# Patient Record
Sex: Female | Born: 1960 | Race: Black or African American | Hispanic: No | Smoking: Current every day smoker
Health system: Southern US, Community
[De-identification: ages and names within clinical notes are randomized; demographics above are authoritative.]

## PROBLEM LIST (undated history)

## (undated) DIAGNOSIS — N760 Acute vaginitis: Secondary | ICD-10-CM

## (undated) DIAGNOSIS — G4730 Sleep apnea, unspecified: Secondary | ICD-10-CM

## (undated) DIAGNOSIS — F329 Major depressive disorder, single episode, unspecified: Secondary | ICD-10-CM

## (undated) DIAGNOSIS — R7303 Prediabetes: Secondary | ICD-10-CM

## (undated) DIAGNOSIS — K219 Gastro-esophageal reflux disease without esophagitis: Secondary | ICD-10-CM

## (undated) DIAGNOSIS — F32A Depression, unspecified: Secondary | ICD-10-CM

## (undated) DIAGNOSIS — I10 Essential (primary) hypertension: Secondary | ICD-10-CM

## (undated) DIAGNOSIS — G8929 Other chronic pain: Secondary | ICD-10-CM

## (undated) DIAGNOSIS — N289 Disorder of kidney and ureter, unspecified: Secondary | ICD-10-CM

## (undated) DIAGNOSIS — B9689 Other specified bacterial agents as the cause of diseases classified elsewhere: Secondary | ICD-10-CM

## (undated) DIAGNOSIS — S0300XA Dislocation of jaw, unspecified side, initial encounter: Secondary | ICD-10-CM

## (undated) DIAGNOSIS — E0590 Thyrotoxicosis, unspecified without thyrotoxic crisis or storm: Secondary | ICD-10-CM

## (undated) DIAGNOSIS — M549 Dorsalgia, unspecified: Secondary | ICD-10-CM

## (undated) DIAGNOSIS — D649 Anemia, unspecified: Secondary | ICD-10-CM

## (undated) DIAGNOSIS — F419 Anxiety disorder, unspecified: Secondary | ICD-10-CM

## (undated) DIAGNOSIS — C801 Malignant (primary) neoplasm, unspecified: Secondary | ICD-10-CM

## (undated) HISTORY — DX: Essential (primary) hypertension: I10

## (undated) HISTORY — DX: Prediabetes: R73.03

## (undated) HISTORY — DX: Thyrotoxicosis, unspecified without thyrotoxic crisis or storm: E05.90

## (undated) HISTORY — PX: DILATION AND CURETTAGE OF UTERUS: SHX78

## (undated) HISTORY — PX: MOUTH SURGERY: SHX715

## (undated) HISTORY — PX: OTHER SURGICAL HISTORY: SHX169

---

## 1997-10-27 ENCOUNTER — Emergency Department (HOSPITAL_COMMUNITY): Admission: EM | Admit: 1997-10-27 | Discharge: 1997-10-27 | Payer: Self-pay | Admitting: Emergency Medicine

## 1997-10-29 ENCOUNTER — Emergency Department (HOSPITAL_COMMUNITY): Admission: EM | Admit: 1997-10-29 | Discharge: 1997-10-29 | Payer: Self-pay | Admitting: Emergency Medicine

## 1998-05-23 ENCOUNTER — Encounter: Admission: RE | Admit: 1998-05-23 | Discharge: 1998-05-23 | Payer: Self-pay | Admitting: Internal Medicine

## 1998-06-29 ENCOUNTER — Inpatient Hospital Stay (HOSPITAL_COMMUNITY): Admission: AD | Admit: 1998-06-29 | Discharge: 1998-06-29 | Payer: Self-pay | Admitting: Obstetrics & Gynecology

## 1998-08-25 ENCOUNTER — Encounter: Admission: RE | Admit: 1998-08-25 | Discharge: 1998-08-25 | Payer: Self-pay | Admitting: Internal Medicine

## 1998-08-25 ENCOUNTER — Emergency Department (HOSPITAL_COMMUNITY): Admission: EM | Admit: 1998-08-25 | Discharge: 1998-08-25 | Payer: Self-pay | Admitting: Internal Medicine

## 1998-08-31 ENCOUNTER — Ambulatory Visit (HOSPITAL_COMMUNITY): Admission: RE | Admit: 1998-08-31 | Discharge: 1998-08-31 | Payer: Self-pay | Admitting: Internal Medicine

## 1998-08-31 ENCOUNTER — Encounter: Admission: RE | Admit: 1998-08-31 | Discharge: 1998-08-31 | Payer: Self-pay | Admitting: Internal Medicine

## 1998-12-26 ENCOUNTER — Emergency Department (HOSPITAL_COMMUNITY): Admission: EM | Admit: 1998-12-26 | Discharge: 1998-12-26 | Payer: Self-pay | Admitting: Emergency Medicine

## 1998-12-26 ENCOUNTER — Encounter: Payer: Self-pay | Admitting: Emergency Medicine

## 1999-04-23 ENCOUNTER — Emergency Department (HOSPITAL_COMMUNITY): Admission: EM | Admit: 1999-04-23 | Discharge: 1999-04-23 | Payer: Self-pay | Admitting: Emergency Medicine

## 2000-02-24 ENCOUNTER — Inpatient Hospital Stay (HOSPITAL_COMMUNITY): Admission: AD | Admit: 2000-02-24 | Discharge: 2000-02-24 | Payer: Self-pay | Admitting: Obstetrics

## 2000-04-21 ENCOUNTER — Encounter: Admission: RE | Admit: 2000-04-21 | Discharge: 2000-04-21 | Payer: Self-pay | Admitting: Internal Medicine

## 2000-04-22 ENCOUNTER — Encounter: Admission: RE | Admit: 2000-04-22 | Discharge: 2000-04-22 | Payer: Self-pay

## 2000-11-08 ENCOUNTER — Encounter: Payer: Self-pay | Admitting: Emergency Medicine

## 2000-11-08 ENCOUNTER — Emergency Department (HOSPITAL_COMMUNITY): Admission: EM | Admit: 2000-11-08 | Discharge: 2000-11-08 | Payer: Self-pay | Admitting: Emergency Medicine

## 2001-01-24 ENCOUNTER — Encounter: Payer: Self-pay | Admitting: Emergency Medicine

## 2001-01-24 ENCOUNTER — Emergency Department (HOSPITAL_COMMUNITY): Admission: EM | Admit: 2001-01-24 | Discharge: 2001-01-24 | Payer: Self-pay | Admitting: Emergency Medicine

## 2001-02-01 ENCOUNTER — Emergency Department (HOSPITAL_COMMUNITY): Admission: EM | Admit: 2001-02-01 | Discharge: 2001-02-01 | Payer: Self-pay | Admitting: Emergency Medicine

## 2001-11-03 ENCOUNTER — Encounter: Admission: RE | Admit: 2001-11-03 | Discharge: 2001-11-03 | Payer: Self-pay | Admitting: *Deleted

## 2002-02-26 ENCOUNTER — Encounter: Payer: Self-pay | Admitting: Emergency Medicine

## 2002-02-26 ENCOUNTER — Emergency Department (HOSPITAL_COMMUNITY): Admission: EM | Admit: 2002-02-26 | Discharge: 2002-02-26 | Payer: Self-pay | Admitting: Emergency Medicine

## 2002-12-23 ENCOUNTER — Emergency Department (HOSPITAL_COMMUNITY): Admission: EM | Admit: 2002-12-23 | Discharge: 2002-12-23 | Payer: Self-pay | Admitting: Emergency Medicine

## 2002-12-23 ENCOUNTER — Encounter: Payer: Self-pay | Admitting: Emergency Medicine

## 2003-01-01 ENCOUNTER — Inpatient Hospital Stay (HOSPITAL_COMMUNITY): Admission: AD | Admit: 2003-01-01 | Discharge: 2003-01-01 | Payer: Self-pay | Admitting: Obstetrics & Gynecology

## 2003-04-22 ENCOUNTER — Emergency Department (HOSPITAL_COMMUNITY): Admission: EM | Admit: 2003-04-22 | Discharge: 2003-04-22 | Payer: Self-pay | Admitting: Emergency Medicine

## 2004-01-31 ENCOUNTER — Encounter (INDEPENDENT_AMBULATORY_CARE_PROVIDER_SITE_OTHER): Payer: Self-pay | Admitting: Specialist

## 2004-01-31 ENCOUNTER — Other Ambulatory Visit: Admission: RE | Admit: 2004-01-31 | Discharge: 2004-01-31 | Payer: Self-pay | Admitting: Obstetrics & Gynecology

## 2004-01-31 ENCOUNTER — Ambulatory Visit: Payer: Self-pay | Admitting: Obstetrics & Gynecology

## 2004-02-16 ENCOUNTER — Ambulatory Visit (HOSPITAL_COMMUNITY): Admission: RE | Admit: 2004-02-16 | Discharge: 2004-02-16 | Payer: Self-pay | Admitting: *Deleted

## 2004-02-28 ENCOUNTER — Ambulatory Visit: Payer: Self-pay | Admitting: Obstetrics & Gynecology

## 2004-10-19 ENCOUNTER — Emergency Department (HOSPITAL_COMMUNITY): Admission: EM | Admit: 2004-10-19 | Discharge: 2004-10-20 | Payer: Self-pay | Admitting: Emergency Medicine

## 2004-12-20 ENCOUNTER — Emergency Department (HOSPITAL_COMMUNITY): Admission: EM | Admit: 2004-12-20 | Discharge: 2004-12-20 | Payer: Self-pay | Admitting: Emergency Medicine

## 2005-09-08 ENCOUNTER — Inpatient Hospital Stay (HOSPITAL_COMMUNITY): Admission: AD | Admit: 2005-09-08 | Discharge: 2005-09-08 | Payer: Self-pay | Admitting: Gynecology

## 2005-10-25 ENCOUNTER — Inpatient Hospital Stay (HOSPITAL_COMMUNITY): Admission: AD | Admit: 2005-10-25 | Discharge: 2005-10-25 | Payer: Self-pay | Admitting: Family Medicine

## 2007-04-04 ENCOUNTER — Emergency Department (HOSPITAL_COMMUNITY): Admission: EM | Admit: 2007-04-04 | Discharge: 2007-04-04 | Payer: Self-pay | Admitting: Emergency Medicine

## 2007-08-13 ENCOUNTER — Ambulatory Visit (HOSPITAL_COMMUNITY): Admission: RE | Admit: 2007-08-13 | Discharge: 2007-08-13 | Payer: Self-pay | Admitting: Obstetrics

## 2008-05-25 ENCOUNTER — Emergency Department (HOSPITAL_COMMUNITY): Admission: EM | Admit: 2008-05-25 | Discharge: 2008-05-25 | Payer: Self-pay | Admitting: Emergency Medicine

## 2009-01-28 ENCOUNTER — Emergency Department (HOSPITAL_COMMUNITY): Admission: EM | Admit: 2009-01-28 | Discharge: 2009-01-28 | Payer: Self-pay | Admitting: Emergency Medicine

## 2009-08-03 ENCOUNTER — Emergency Department (HOSPITAL_COMMUNITY): Admission: EM | Admit: 2009-08-03 | Discharge: 2009-08-03 | Payer: Self-pay | Admitting: Emergency Medicine

## 2009-12-24 ENCOUNTER — Emergency Department (HOSPITAL_COMMUNITY): Admission: EM | Admit: 2009-12-24 | Discharge: 2009-12-24 | Payer: Self-pay | Admitting: Emergency Medicine

## 2010-05-05 ENCOUNTER — Encounter: Payer: Self-pay | Admitting: *Deleted

## 2010-06-28 LAB — RAPID STREP SCREEN (MED CTR MEBANE ONLY): Streptococcus, Group A Screen (Direct): NEGATIVE

## 2010-07-03 LAB — URINE MICROSCOPIC-ADD ON

## 2010-07-03 LAB — URINALYSIS, ROUTINE W REFLEX MICROSCOPIC
Bilirubin Urine: NEGATIVE
Glucose, UA: NEGATIVE mg/dL
Ketones, ur: NEGATIVE mg/dL
Leukocytes, UA: NEGATIVE
Nitrite: NEGATIVE
Protein, ur: NEGATIVE mg/dL
Specific Gravity, Urine: 1.025 (ref 1.005–1.030)
Urobilinogen, UA: 0.2 mg/dL (ref 0.0–1.0)
pH: 5.5 (ref 5.0–8.0)

## 2010-07-03 LAB — POCT PREGNANCY, URINE: Preg Test, Ur: NEGATIVE

## 2010-08-31 NOTE — Group Therapy Note (Signed)
NAME:  Beth Cole, Beth Cole NO.:  [id number]   MEDICAL RECORD NO.:  [id number]          PATIENT TYPE:  WOC   LOCATION:  [facility]                   FACILITY:  [facility]   PHYSICIAN:  Elsie Lincoln, MD      DATE OF BIRTH:  10-06-1960   DATE OF SERVICE:  02/28/2004                                    CLINIC NOTE   REASON FOR VISIT:  The patient presents for follow-up from a January 31, 2004 visit.  She had one [facility] appointment on February 21, 2004.  The patient  was seen on October 18 for a 30-month-long menses.  She had an endometrial  biopsy done which was negative, Pap smear was negative.  GC and chlamydia  were also done and she is positive for chlamydia.  She was treated with  Zithromax and she said all her partners were as well.  The wet prep was done  which showed bacterial vaginosis and she said she took 7 days of Flagyl  b.i.d..  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  The patient also had a negative BIRADS 1  mammogram on February 16, 2004.  Today, the patient presents for her test  results and test of cure for chlamydia.  Of note, she complains of  depression and wants to start on medication.  She states she is having  problems sleeping and eating and just feels generally bad.  She is lacking  motivation, feels irritable, moody, and these meet the criteria for  depression.  She does deny suicidal ideation.  No physical exam was done  today.  The patient also states she is going through questions that her  husband wants another baby.  However, she is undergoing a lot of social  issues right now and does not feel that she wants one.  However, given she  has had a laparoscopic BTL, she would be referred to the infertility clinic  at [facility] for either tubal reanastomosis versus IVF.   ASSESSMENT AND PLAN:  A 50 year old female  with abnormal uterine bleeding  that is now resolved, no evidence of malignancy, questionable submucosal  fibroid.   1.  TOC today for chlamydia.  2.  The patient decides no further testing right now for abnormal uterine      bleeding.  If the bleeding reoccurs, will refer for saline      sonohysterogram.  3.  Start Zoloft 50 mg p.o. daily, 2 refills given.  The patient is to now      go to [facility] for follow-up of her depression.  The patient also      told to go to the emergency room if she has any suicidal ideation.  4.  Referral to [facility] if she desires pregnancy.  5.  Return to clinic in October 2006 for Pap smear, or p.r.n. if problem      arises.      KL/MEDQ  D:  02/28/2004  T:  02/28/2004  Job:  [id number]

## 2010-08-31 NOTE — Group Therapy Note (Signed)
NAME:  NZINGA, Beth Cole NO.:  [id number]   MEDICAL RECORD NO.:  [id number]          PATIENT TYPE:  WOC   LOCATION:  [facility]                   FACILITY:  [facility]   PHYSICIAN:  Elsie Lincoln, MD      DATE OF BIRTH:  [birth year]   DATE OF SERVICE:  01/31/2004                                    CLINIC NOTE   REASON FOR VISIT:  The patient is a 50 year old para 3-0-3-3 female who has  been seen in our clinic many years ago who presents for abnormal bleeding x1  month.  The patient states that her menses were completely regular every  month until this past month, and then since then she has bled for 5 days,  stopped for 5 days, bled for 5 days, and stopped for 5 days continuing on  the entire month.  She says there is no cramping associated with this.  She  had gone to the MAU earlier this month; however, left before being seen.  UCG was negative today.   PAST MEDICAL HISTORY:  The patient states she has hypertension; however, her  blood pressure today is 110/85.   PAST SURGICAL HISTORY:  Laparoscopic BTL.   GYNECOLOGICAL HISTORY:  Three NSVDs, two miscarriages, and one TOP.  The  patient states she had heavy bleeding with one of her vaginal deliveries  requiring going to the OR.  The patient does not have a scar on her belly,  so there was not a hysterectomy done or any heroic means done  transabdominally.  The patient states she may have been packed; she does not  know what happened.  She has had no problems bleeding since then.  The  patient states she maybe has fibroids and maybe has ovarian cysts, is not  being treated for these problems currently.  She has had STDs in the past;  however, she will not qualify what they are, when they were, but she said  they are so far in her past that it is not relevant.  She is sexually active  currently and she uses condoms for protection against STDs, and she does  have a BTL for prevention of pregnancy.   ALLERGIES:   Questionable PENICILLIN.   CURRENT MEDICATIONS:  A sleep aid and Advil.   PHYSICAL EXAMINATION:  VITAL SIGNS:  Temperature 97.2, pulse 98, blood  pressure 110/85, weight 128.3, height 5 feet 2 inches.  ABDOMEN:  Soft, nontender, nondistended.  No rebound, no guarding.  PELVIC:  Genitalia:  Tanner V.  Vagina:  Pink, normal rugae.  No blood or  discharge.  Cervix:  Closed, nontender.  Uterus:  Small, anteverted,  nontender, mobile.  Adnexa:  Ovaries palpated, small, nontender, no masses.   ASSESSMENT AND PLAN:  A 50 year old female with abnormal uterine bleeding.   1.  Pap smear, cultures, and wet prep done.  2.  Endometrial biopsy done.  3.  CBC done to see if she is anemic.  4.  Transvaginal ultrasound ordered.  5.  The patient needs a mammogram ordered next visit.  6.  Smoking cessation encouraged.      KL/MEDQ  D:  01/31/2004  T:  01/31/2004  Job:  [id number]

## 2010-09-30 ENCOUNTER — Emergency Department (HOSPITAL_COMMUNITY)
Admission: EM | Admit: 2010-09-30 | Discharge: 2010-09-30 | Disposition: A | Discharge status: Home / Self Care | Payer: Self-pay | Attending: Emergency Medicine | Admitting: Emergency Medicine

## 2010-09-30 DIAGNOSIS — R209 Unspecified disturbances of skin sensation: Secondary | ICD-10-CM | POA: Insufficient documentation

## 2010-09-30 DIAGNOSIS — M79609 Pain in unspecified limb: Secondary | ICD-10-CM | POA: Insufficient documentation

## 2010-09-30 DIAGNOSIS — R5383 Other fatigue: Secondary | ICD-10-CM | POA: Insufficient documentation

## 2010-09-30 DIAGNOSIS — K219 Gastro-esophageal reflux disease without esophagitis: Secondary | ICD-10-CM | POA: Insufficient documentation

## 2010-09-30 DIAGNOSIS — R5381 Other malaise: Secondary | ICD-10-CM | POA: Insufficient documentation

## 2010-11-26 ENCOUNTER — Emergency Department (HOSPITAL_COMMUNITY)
Admission: EM | Admit: 2010-11-26 | Discharge: 2010-11-26 | Disposition: A | Discharge status: Home / Self Care | Payer: Self-pay | Attending: Emergency Medicine | Admitting: Emergency Medicine

## 2010-11-26 DIAGNOSIS — N938 Other specified abnormal uterine and vaginal bleeding: Secondary | ICD-10-CM | POA: Insufficient documentation

## 2010-11-26 DIAGNOSIS — N949 Unspecified condition associated with female genital organs and menstrual cycle: Secondary | ICD-10-CM | POA: Insufficient documentation

## 2010-11-26 LAB — URINALYSIS, ROUTINE W REFLEX MICROSCOPIC
Bilirubin Urine: NEGATIVE
Glucose, UA: NEGATIVE mg/dL
Ketones, ur: NEGATIVE mg/dL
Leukocytes, UA: NEGATIVE
Nitrite: NEGATIVE
Protein, ur: NEGATIVE mg/dL
Specific Gravity, Urine: 1.03 (ref 1.005–1.030)
Urobilinogen, UA: 0.2 mg/dL (ref 0.0–1.0)
pH: 5 (ref 5.0–8.0)

## 2010-11-26 LAB — WET PREP, GENITAL
Trich, Wet Prep: NONE SEEN
Yeast Wet Prep HPF POC: NONE SEEN

## 2010-11-26 LAB — POCT PREGNANCY, URINE: Preg Test, Ur: NEGATIVE

## 2010-11-26 LAB — URINE MICROSCOPIC-ADD ON

## 2010-11-27 LAB — GC/CHLAMYDIA PROBE AMP, GENITAL
Chlamydia, DNA Probe: NEGATIVE
GC Probe Amp, Genital: NEGATIVE

## 2011-01-18 LAB — URINALYSIS, ROUTINE W REFLEX MICROSCOPIC
Bilirubin Urine: NEGATIVE
Glucose, UA: NEGATIVE
Ketones, ur: NEGATIVE
Nitrite: NEGATIVE
Protein, ur: 100 — AB
Specific Gravity, Urine: 1.036 — ABNORMAL HIGH
Urobilinogen, UA: 0.2
pH: 6

## 2011-01-18 LAB — DIFFERENTIAL
Basophils Absolute: 0
Basophils Relative: 0
Eosinophils Absolute: 0.1 — ABNORMAL LOW
Neutrophils Relative %: 79 — ABNORMAL HIGH

## 2011-01-18 LAB — CBC
HCT: 34.2 — ABNORMAL LOW
Hemoglobin: 11.6 — ABNORMAL LOW
MCHC: 34
MCV: 83.6
Platelets: 357
RBC: 4.1
RDW: 13.5
WBC: 10.3

## 2011-01-18 LAB — COMPREHENSIVE METABOLIC PANEL
ALT: 11
Alkaline Phosphatase: 61
CO2: 27
Calcium: 9.4
GFR calc non Af Amer: >60
Glucose, Bld: 101 — ABNORMAL HIGH
Potassium: 3.5
Sodium: 136

## 2011-01-18 LAB — URINE MICROSCOPIC-ADD ON

## 2011-01-18 LAB — PREGNANCY, URINE: Preg Test, Ur: NEGATIVE

## 2011-01-18 LAB — LIPASE, BLOOD: Lipase: 69 — ABNORMAL HIGH

## 2011-01-18 LAB — WET PREP, GENITAL: Yeast Wet Prep HPF POC: NONE SEEN

## 2011-01-23 ENCOUNTER — Emergency Department (HOSPITAL_COMMUNITY)
Admission: EM | Admit: 2011-01-23 | Discharge: 2011-01-24 | Disposition: A | Discharge status: Home / Self Care | Payer: Self-pay | Attending: Emergency Medicine | Admitting: Emergency Medicine

## 2011-01-23 DIAGNOSIS — S335XXA Sprain of ligaments of lumbar spine, initial encounter: Secondary | ICD-10-CM | POA: Insufficient documentation

## 2011-01-23 DIAGNOSIS — M549 Dorsalgia, unspecified: Secondary | ICD-10-CM | POA: Insufficient documentation

## 2011-01-23 DIAGNOSIS — X58XXXA Exposure to other specified factors, initial encounter: Secondary | ICD-10-CM | POA: Insufficient documentation

## 2011-01-23 DIAGNOSIS — J040 Acute laryngitis: Secondary | ICD-10-CM | POA: Insufficient documentation

## 2011-10-18 ENCOUNTER — Encounter (HOSPITAL_COMMUNITY): Payer: Self-pay

## 2011-10-18 ENCOUNTER — Inpatient Hospital Stay (HOSPITAL_COMMUNITY)
Admission: AD | Admit: 2011-10-18 | Discharge: 2011-10-18 | Disposition: A | Discharge status: Home / Self Care | Payer: Medicaid Other | Source: Ambulatory Visit | Attending: Obstetrics & Gynecology | Admitting: Obstetrics & Gynecology

## 2011-10-18 DIAGNOSIS — N760 Acute vaginitis: Secondary | ICD-10-CM

## 2011-10-18 DIAGNOSIS — B9689 Other specified bacterial agents as the cause of diseases classified elsewhere: Secondary | ICD-10-CM | POA: Insufficient documentation

## 2011-10-18 DIAGNOSIS — A499 Bacterial infection, unspecified: Secondary | ICD-10-CM

## 2011-10-18 DIAGNOSIS — N949 Unspecified condition associated with female genital organs and menstrual cycle: Secondary | ICD-10-CM | POA: Insufficient documentation

## 2011-10-18 HISTORY — DX: Depression, unspecified: F32.A

## 2011-10-18 HISTORY — DX: Acute vaginitis: N76.0

## 2011-10-18 HISTORY — DX: Major depressive disorder, single episode, unspecified: F32.9

## 2011-10-18 HISTORY — DX: Other chronic pain: G89.29

## 2011-10-18 HISTORY — DX: Gastro-esophageal reflux disease without esophagitis: K21.9

## 2011-10-18 HISTORY — DX: Other specified bacterial agents as the cause of diseases classified elsewhere: B96.89

## 2011-10-18 HISTORY — DX: Dorsalgia, unspecified: M54.9

## 2011-10-18 HISTORY — DX: Anxiety disorder, unspecified: F41.9

## 2011-10-18 HISTORY — DX: Sleep apnea, unspecified: G47.30

## 2011-10-18 LAB — WET PREP, GENITAL: Trich, Wet Prep: NONE SEEN

## 2011-10-18 MED ORDER — METRONIDAZOLE 500 MG PO TABS: 500.0000 mg | ORAL_TABLET | Freq: Two times a day (BID) | ORAL | Status: AC

## 2011-10-18 NOTE — MAU Note (Signed)
Patient is in with c/o broken off tampon for 2 days. She states that 2 days ago she was removing her tampon and the string snapped with half of the tampon out. She states that the remainder is still inside and she waited to see if it will come out on its own. She now have odor and slight discomfort. She states that she did not attempt to retrieve it because when this type of thing happened to her in the past. She scratched herself it caused an infection. She states that she have no bleeding now,
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
## 2011-10-18 NOTE — MAU Note (Signed)
I tried to pull tampon out few days ago and it broke off. Very irritated down there"

## 2011-10-19 LAB — GC/CHLAMYDIA PROBE AMP, GENITAL
Chlamydia, DNA Probe: NEGATIVE
GC Probe Amp, Genital: NEGATIVE

## 2011-10-19 NOTE — MAU Provider Note (Signed)
Attestation of Attending Supervision of Advanced Practitioner (CNM/NP): Evaluation and management procedures were performed by the Advanced Practitioner under my supervision and collaboration.  I have reviewed the Advanced Practitioner's note and chart, and I agree with the management and plan.  Latrelle Bazar, M.D. 10/19/2011 7:35 AM  

## 2012-04-19 ENCOUNTER — Emergency Department (HOSPITAL_COMMUNITY)
Admission: EM | Admit: 2012-04-19 | Discharge: 2012-04-19 | Disposition: A | Discharge status: Home / Self Care | Payer: Medicaid Other | Attending: Emergency Medicine | Admitting: Emergency Medicine

## 2012-04-19 ENCOUNTER — Emergency Department (HOSPITAL_COMMUNITY): Payer: Medicaid Other

## 2012-04-19 ENCOUNTER — Encounter (HOSPITAL_COMMUNITY): Payer: Self-pay | Admitting: *Deleted

## 2012-04-19 DIAGNOSIS — G8929 Other chronic pain: Secondary | ICD-10-CM | POA: Insufficient documentation

## 2012-04-19 DIAGNOSIS — R059 Cough, unspecified: Secondary | ICD-10-CM | POA: Insufficient documentation

## 2012-04-19 DIAGNOSIS — Z79899 Other long term (current) drug therapy: Secondary | ICD-10-CM | POA: Insufficient documentation

## 2012-04-19 DIAGNOSIS — B9789 Other viral agents as the cause of diseases classified elsewhere: Secondary | ICD-10-CM | POA: Insufficient documentation

## 2012-04-19 DIAGNOSIS — K219 Gastro-esophageal reflux disease without esophagitis: Secondary | ICD-10-CM | POA: Insufficient documentation

## 2012-04-19 DIAGNOSIS — J3489 Other specified disorders of nose and nasal sinuses: Secondary | ICD-10-CM | POA: Insufficient documentation

## 2012-04-19 DIAGNOSIS — M549 Dorsalgia, unspecified: Secondary | ICD-10-CM | POA: Insufficient documentation

## 2012-04-19 DIAGNOSIS — R05 Cough: Secondary | ICD-10-CM | POA: Insufficient documentation

## 2012-04-19 DIAGNOSIS — Z87828 Personal history of other (healed) physical injury and trauma: Secondary | ICD-10-CM | POA: Insufficient documentation

## 2012-04-19 DIAGNOSIS — B349 Viral infection, unspecified: Secondary | ICD-10-CM

## 2012-04-19 DIAGNOSIS — Z8742 Personal history of other diseases of the female genital tract: Secondary | ICD-10-CM | POA: Insufficient documentation

## 2012-04-19 DIAGNOSIS — J029 Acute pharyngitis, unspecified: Secondary | ICD-10-CM | POA: Insufficient documentation

## 2012-04-19 DIAGNOSIS — Z8659 Personal history of other mental and behavioral disorders: Secondary | ICD-10-CM | POA: Insufficient documentation

## 2012-04-19 DIAGNOSIS — F17200 Nicotine dependence, unspecified, uncomplicated: Secondary | ICD-10-CM | POA: Insufficient documentation

## 2012-04-19 DIAGNOSIS — Z3202 Encounter for pregnancy test, result negative: Secondary | ICD-10-CM | POA: Insufficient documentation

## 2012-04-19 DIAGNOSIS — G4730 Sleep apnea, unspecified: Secondary | ICD-10-CM | POA: Insufficient documentation

## 2012-04-19 DIAGNOSIS — H9209 Otalgia, unspecified ear: Secondary | ICD-10-CM | POA: Insufficient documentation

## 2012-04-19 DIAGNOSIS — IMO0001 Reserved for inherently not codable concepts without codable children: Secondary | ICD-10-CM | POA: Insufficient documentation

## 2012-04-19 HISTORY — DX: Dislocation of jaw, unspecified side, initial encounter: S03.00XA

## 2012-04-19 MED ORDER — HYDROCODONE-HOMATROPINE 5-1.5 MG/5ML PO SYRP: 5.0000 mL | ORAL_SOLUTION | Freq: Four times a day (QID) | ORAL | Status: DC | PRN

## 2012-04-19 MED ORDER — GI COCKTAIL ~~LOC~~
30.0000 mL | Freq: Once | ORAL | Status: AC
  Administered 2012-04-19: 30 mL via ORAL
  Filled 2012-04-19: qty 30

## 2012-04-19 NOTE — ED Notes (Signed)
C/O TMJ pain bilaterally- recently dx with TMJ pain- hurts to open mouth, hurts with palpation over area
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
## 2012-04-19 NOTE — ED Notes (Signed)
Pt experiencing sore throat, runny nose, cough and "sweating" since Fri.  Afebrile at this time.  Pt came in today b/c of throat pain and tmj is continuing to bother her.

## 2012-05-31 ENCOUNTER — Emergency Department (HOSPITAL_COMMUNITY): Payer: Medicaid Other

## 2012-05-31 ENCOUNTER — Encounter (HOSPITAL_COMMUNITY): Payer: Self-pay | Admitting: Emergency Medicine

## 2012-05-31 ENCOUNTER — Emergency Department (HOSPITAL_COMMUNITY)
Admission: EM | Admit: 2012-05-31 | Discharge: 2012-05-31 | Disposition: A | Discharge status: Home / Self Care | Payer: Medicaid Other | Attending: Emergency Medicine | Admitting: Emergency Medicine

## 2012-05-31 DIAGNOSIS — Z8659 Personal history of other mental and behavioral disorders: Secondary | ICD-10-CM | POA: Insufficient documentation

## 2012-05-31 DIAGNOSIS — G8929 Other chronic pain: Secondary | ICD-10-CM | POA: Insufficient documentation

## 2012-05-31 DIAGNOSIS — Z79899 Other long term (current) drug therapy: Secondary | ICD-10-CM | POA: Insufficient documentation

## 2012-05-31 DIAGNOSIS — G4730 Sleep apnea, unspecified: Secondary | ICD-10-CM | POA: Insufficient documentation

## 2012-05-31 DIAGNOSIS — M545 Low back pain, unspecified: Secondary | ICD-10-CM | POA: Insufficient documentation

## 2012-05-31 DIAGNOSIS — F17200 Nicotine dependence, unspecified, uncomplicated: Secondary | ICD-10-CM | POA: Insufficient documentation

## 2012-05-31 DIAGNOSIS — K219 Gastro-esophageal reflux disease without esophagitis: Secondary | ICD-10-CM | POA: Insufficient documentation

## 2012-05-31 DIAGNOSIS — Z8739 Personal history of other diseases of the musculoskeletal system and connective tissue: Secondary | ICD-10-CM | POA: Insufficient documentation

## 2012-05-31 DIAGNOSIS — R319 Hematuria, unspecified: Secondary | ICD-10-CM | POA: Insufficient documentation

## 2012-05-31 DIAGNOSIS — D219 Benign neoplasm of connective and other soft tissue, unspecified: Secondary | ICD-10-CM | POA: Insufficient documentation

## 2012-05-31 DIAGNOSIS — D3002 Benign neoplasm of left kidney: Secondary | ICD-10-CM

## 2012-05-31 DIAGNOSIS — Z8619 Personal history of other infectious and parasitic diseases: Secondary | ICD-10-CM | POA: Insufficient documentation

## 2012-05-31 LAB — URINALYSIS, ROUTINE W REFLEX MICROSCOPIC
Glucose, UA: NEGATIVE mg/dL
Ketones, ur: NEGATIVE mg/dL
Protein, ur: NEGATIVE mg/dL

## 2012-05-31 LAB — LIPASE, BLOOD: Lipase: 41 U/L (ref 11–59)

## 2012-05-31 LAB — URINE MICROSCOPIC-ADD ON

## 2012-05-31 LAB — POCT I-STAT TROPONIN I

## 2012-05-31 LAB — PREGNANCY, URINE: Preg Test, Ur: NEGATIVE
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
## 2012-05-31 NOTE — ED Notes (Addendum)
Reports falling 2 weeks ago on her steps and PCP and specialist told her she has blood in urine after fall.  Pt believes she still has blood in urine and is currently having her period.  Pt also reports 2 other falls in the past 2 weeks. Denies known cause of falls and denies dizziness prior to falls.  Denies LOC. Reports abd swelling.

## 2012-05-31 NOTE — ED Notes (Signed)
Pt reports fell 2 weeks ago. Pt seen by PMD and other specialist and was told to come to ED for further eval of blood in urine. Pt also reports abdominal swelling since fall along with intermittent pain and n/v. Pt last BM 2 days ago.

## 2012-05-31 NOTE — ED Notes (Signed)
Karen gave the patient a warm blanket. 

## 2012-05-31 NOTE — ED Notes (Signed)
I gave the patient another warm blanket.

## 2012-07-10 ENCOUNTER — Encounter (HOSPITAL_COMMUNITY): Payer: Self-pay | Admitting: *Deleted

## 2012-07-10 ENCOUNTER — Emergency Department (HOSPITAL_COMMUNITY)
Admission: EM | Admit: 2012-07-10 | Discharge: 2012-07-10 | Disposition: A | Discharge status: Home / Self Care | Payer: Medicaid Other | Attending: Emergency Medicine | Admitting: Emergency Medicine

## 2012-07-10 DIAGNOSIS — K219 Gastro-esophageal reflux disease without esophagitis: Secondary | ICD-10-CM | POA: Insufficient documentation

## 2012-07-10 DIAGNOSIS — Z8742 Personal history of other diseases of the female genital tract: Secondary | ICD-10-CM | POA: Insufficient documentation

## 2012-07-10 DIAGNOSIS — R319 Hematuria, unspecified: Secondary | ICD-10-CM | POA: Insufficient documentation

## 2012-07-10 DIAGNOSIS — R109 Unspecified abdominal pain: Secondary | ICD-10-CM

## 2012-07-10 DIAGNOSIS — Z87828 Personal history of other (healed) physical injury and trauma: Secondary | ICD-10-CM | POA: Insufficient documentation

## 2012-07-10 DIAGNOSIS — N898 Other specified noninflammatory disorders of vagina: Secondary | ICD-10-CM | POA: Insufficient documentation

## 2012-07-10 DIAGNOSIS — G4730 Sleep apnea, unspecified: Secondary | ICD-10-CM | POA: Insufficient documentation

## 2012-07-10 DIAGNOSIS — D4959 Neoplasm of unspecified behavior of other genitourinary organ: Secondary | ICD-10-CM | POA: Insufficient documentation

## 2012-07-10 DIAGNOSIS — Z79899 Other long term (current) drug therapy: Secondary | ICD-10-CM | POA: Insufficient documentation

## 2012-07-10 DIAGNOSIS — F17200 Nicotine dependence, unspecified, uncomplicated: Secondary | ICD-10-CM | POA: Insufficient documentation

## 2012-07-10 DIAGNOSIS — Z8739 Personal history of other diseases of the musculoskeletal system and connective tissue: Secondary | ICD-10-CM | POA: Insufficient documentation

## 2012-07-10 DIAGNOSIS — Z8659 Personal history of other mental and behavioral disorders: Secondary | ICD-10-CM | POA: Insufficient documentation

## 2012-07-10 HISTORY — DX: Disorder of kidney and ureter, unspecified: N28.9

## 2012-07-10 LAB — CBC
HCT: 34.1 % — ABNORMAL LOW (ref 36.0–46.0)
MCHC: 32.8 g/dL (ref 30.0–36.0)
MCV: 85.3 fL (ref 78.0–100.0)
Platelets: 311 10*3/uL (ref 150–400)
RDW: 13.1 % (ref 11.5–15.5)

## 2012-07-10 LAB — COMPREHENSIVE METABOLIC PANEL
ALT: 15 U/L (ref 0–35)
AST: 16 U/L (ref 0–37)
Albumin: 3.4 g/dL — ABNORMAL LOW (ref 3.5–5.2)
Alkaline Phosphatase: 52 U/L (ref 39–117)
BUN: 10 mg/dL (ref 6–23)
CO2: 25 mEq/L (ref 19–32)
Calcium: 9.3 mg/dL (ref 8.4–10.5)
Chloride: 107 mEq/L (ref 96–112)
Creatinine, Ser: 0.84 mg/dL (ref 0.50–1.10)
GFR calc Af Amer: >90 mL/min (ref >90)
GFR calc non Af Amer: 80 mL/min — ABNORMAL LOW (ref >90)
Glucose, Bld: 121 mg/dL — ABNORMAL HIGH (ref 70–99)
Potassium: 3.9 mEq/L (ref 3.5–5.1)
Sodium: 140 mEq/L (ref 135–145)
Total Bilirubin: 0.1 mg/dL — ABNORMAL LOW (ref 0.3–1.2)
Total Protein: 6.8 g/dL (ref 6.0–8.3)

## 2012-07-10 LAB — URINE MICROSCOPIC-ADD ON

## 2012-07-10 LAB — URINALYSIS, ROUTINE W REFLEX MICROSCOPIC
Glucose, UA: NEGATIVE mg/dL
Ketones, ur: 15 mg/dL — AB
Protein, ur: 30 mg/dL — AB
Urobilinogen, UA: 1 mg/dL (ref 0.0–1.0)

## 2012-07-10 MED ORDER — OXYCODONE-ACETAMINOPHEN 5-325 MG PO TABS: 2.0000 | ORAL_TABLET | Freq: Four times a day (QID) | ORAL | Status: DC | PRN

## 2012-07-10 MED ORDER — OXYCODONE-ACETAMINOPHEN 5-325 MG PO TABS
2.0000 | ORAL_TABLET | Freq: Once | ORAL | Status: AC
  Administered 2012-07-10: 2 via ORAL
  Filled 2012-07-10: qty 2

## 2012-07-10 NOTE — ED Notes (Signed)
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  Pt was seen by Dr Brunilda Payor at [facility] urology and stated he would perform procedure in 2 wks.  Yesterday pt began having blood in urine.  Pt called urologist and he didn't respond.  She called her pcp who stated to come to ED.  Pt states her L flank continues to cause her pain. No improvement from discharge.
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
## 2012-08-20 ENCOUNTER — Other Ambulatory Visit: Payer: Self-pay | Admitting: Internal Medicine

## 2012-08-20 DIAGNOSIS — M549 Dorsalgia, unspecified: Secondary | ICD-10-CM

## 2012-08-27 ENCOUNTER — Other Ambulatory Visit: Payer: Self-pay

## 2012-09-16 ENCOUNTER — Other Ambulatory Visit: Payer: Self-pay | Admitting: Internal Medicine

## 2012-09-16 DIAGNOSIS — Z1231 Encounter for screening mammogram for malignant neoplasm of breast: Secondary | ICD-10-CM

## 2012-11-18 ENCOUNTER — Ambulatory Visit: Payer: Self-pay

## 2012-11-26 ENCOUNTER — Ambulatory Visit: Payer: Medicaid Other

## 2012-12-11 ENCOUNTER — Encounter (HOSPITAL_COMMUNITY): Payer: Self-pay | Admitting: Emergency Medicine

## 2012-12-11 ENCOUNTER — Emergency Department (HOSPITAL_COMMUNITY)
Admission: EM | Admit: 2012-12-11 | Discharge: 2012-12-11 | Disposition: A | Discharge status: Home / Self Care | Payer: Medicaid Other | Attending: Emergency Medicine | Admitting: Emergency Medicine

## 2012-12-11 DIAGNOSIS — J069 Acute upper respiratory infection, unspecified: Secondary | ICD-10-CM | POA: Insufficient documentation

## 2012-12-11 DIAGNOSIS — J3489 Other specified disorders of nose and nasal sinuses: Secondary | ICD-10-CM | POA: Insufficient documentation

## 2012-12-11 DIAGNOSIS — K219 Gastro-esophageal reflux disease without esophagitis: Secondary | ICD-10-CM | POA: Insufficient documentation

## 2012-12-11 DIAGNOSIS — J310 Chronic rhinitis: Secondary | ICD-10-CM | POA: Insufficient documentation

## 2012-12-11 DIAGNOSIS — R0982 Postnasal drip: Secondary | ICD-10-CM | POA: Insufficient documentation

## 2012-12-11 DIAGNOSIS — R220 Localized swelling, mass and lump, head: Secondary | ICD-10-CM | POA: Insufficient documentation

## 2012-12-11 DIAGNOSIS — F17200 Nicotine dependence, unspecified, uncomplicated: Secondary | ICD-10-CM | POA: Insufficient documentation

## 2012-12-11 DIAGNOSIS — R05 Cough: Secondary | ICD-10-CM | POA: Insufficient documentation

## 2012-12-11 DIAGNOSIS — H5789 Other specified disorders of eye and adnexa: Secondary | ICD-10-CM | POA: Insufficient documentation

## 2012-12-11 DIAGNOSIS — G8929 Other chronic pain: Secondary | ICD-10-CM | POA: Insufficient documentation

## 2012-12-11 DIAGNOSIS — F341 Dysthymic disorder: Secondary | ICD-10-CM | POA: Insufficient documentation

## 2012-12-11 DIAGNOSIS — Z87448 Personal history of other diseases of urinary system: Secondary | ICD-10-CM | POA: Insufficient documentation

## 2012-12-11 DIAGNOSIS — R059 Cough, unspecified: Secondary | ICD-10-CM | POA: Insufficient documentation

## 2012-12-11 DIAGNOSIS — R062 Wheezing: Secondary | ICD-10-CM | POA: Insufficient documentation

## 2012-12-11 DIAGNOSIS — Z8742 Personal history of other diseases of the female genital tract: Secondary | ICD-10-CM | POA: Insufficient documentation

## 2012-12-11 DIAGNOSIS — Z79899 Other long term (current) drug therapy: Secondary | ICD-10-CM | POA: Insufficient documentation

## 2012-12-11 DIAGNOSIS — Z8619 Personal history of other infectious and parasitic diseases: Secondary | ICD-10-CM | POA: Insufficient documentation

## 2012-12-11 DIAGNOSIS — R509 Fever, unspecified: Secondary | ICD-10-CM | POA: Insufficient documentation

## 2012-12-11 DIAGNOSIS — Z87828 Personal history of other (healed) physical injury and trauma: Secondary | ICD-10-CM | POA: Insufficient documentation

## 2012-12-11 MED ORDER — FLUTICASONE PROPIONATE 50 MCG/ACT NA SUSP: 2.0000 | Freq: Every day | NASAL | Status: DC
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
## 2012-12-11 NOTE — ED Provider Notes (Signed)
Medical screening examination/treatment/procedure(s) were performed by non-physician practitioner and as supervising physician I was immediately available for consultation/collaboration.   William Alister Staver, MD 12/11/12 2050 

## 2012-12-11 NOTE — ED Notes (Signed)
Pt reports having a small "bump" on the inner right eye between the nose. Pt reports "popping" the bump, despite recommendations from her ophthalmologist to not attempt to pop it. Pt reports the area is sensitive, and has developed a small bump on her nose and has a sore throat.

## 2013-01-28 ENCOUNTER — Encounter: Payer: Self-pay | Admitting: Advanced Practice Midwife

## 2013-02-19 ENCOUNTER — Ambulatory Visit: Payer: Self-pay | Admitting: Advanced Practice Midwife

## 2013-03-16 ENCOUNTER — Ambulatory Visit: Payer: Medicaid Other | Admitting: Advanced Practice Midwife

## 2013-03-23 ENCOUNTER — Ambulatory Visit (INDEPENDENT_AMBULATORY_CARE_PROVIDER_SITE_OTHER): Payer: Medicaid Other | Admitting: Obstetrics

## 2013-03-23 ENCOUNTER — Encounter: Payer: Self-pay | Admitting: Obstetrics

## 2013-03-23 VITALS — BP 138/86 | HR 99 | Temp 98.3°F | Wt 154.0 lb

## 2013-03-23 DIAGNOSIS — N393 Stress incontinence (female) (male): Secondary | ICD-10-CM

## 2013-03-23 DIAGNOSIS — N949 Unspecified condition associated with female genital organs and menstrual cycle: Secondary | ICD-10-CM

## 2013-03-23 DIAGNOSIS — N920 Excessive and frequent menstruation with regular cycle: Secondary | ICD-10-CM

## 2013-03-23 DIAGNOSIS — Z0000 Encounter for general adult medical examination without abnormal findings: Secondary | ICD-10-CM

## 2013-03-23 DIAGNOSIS — N760 Acute vaginitis: Secondary | ICD-10-CM

## 2013-03-23 MED ORDER — OXYCODONE HCL 10 MG PO TABS: ORAL_TABLET | ORAL | Status: DC

## 2013-03-23 NOTE — Progress Notes (Signed)
Subjective:     Beth Cole is a 52 y.o. female here for a problem exam.  Current complaints: pt states that she is having a constant menstral cycle. Pt states that she will have her regular cycle and a few days later will start bleeding.  Pt is also concerned about sexual intercourse.  Pt states that it is very painful during intercourse.  Pt states that she is also having lower abdominal pain as well.    Personal health questionnaire reviewed: yes.   Gynecologic History No LMP recorded. Contraception: none Last Pap: unsure. Results were: normal Last mammogram: years ago. Results were: normal  Obstetric History OB History  Gravida Para Term Preterm AB SAB TAB Ectopic Multiple Living  6 3 2 1 3  3   3     # Outcome Date GA Lbr Len/2nd Weight Sex Delivery Anes PTL Lv  6 TRM           5 TRM           4 PRE           3 TAB           2 TAB           1 TAB                The following portions of the patient's history were reviewed and updated as appropriate: allergies, current medications, past family history, past medical history, past social history, past surgical history and problem list.  Review of Systems Pertinent items are noted in HPI.    Objective:    General appearance: alert and no distress Abdomen: normal findings: soft, non-tender Pelvic: cervix normal in appearance, external genitalia normal, no adnexal masses or tenderness, no cervical motion tenderness, rectovaginal septum normal, uterus normal size, shape, and consistency and vagina normal without discharge Extremities: extremities normal, atraumatic, no cyanosis or edema    Assessment:    Pelvic pain and dyspareunia.  AUB.  SUI   Plan:    Education reviewed: Pelvic pain and AUB management from possible fibroids.. Follow up in: 2 weeks. Ultrasound ordered.

## 2013-03-24 ENCOUNTER — Encounter: Payer: Self-pay | Admitting: Obstetrics

## 2013-03-24 ENCOUNTER — Other Ambulatory Visit: Payer: Self-pay | Admitting: *Deleted

## 2013-03-24 DIAGNOSIS — B9689 Other specified bacterial agents as the cause of diseases classified elsewhere: Secondary | ICD-10-CM

## 2013-03-24 LAB — GC/CHLAMYDIA PROBE AMP
CT Probe RNA: NEGATIVE
GC Probe RNA: NEGATIVE

## 2013-03-24 LAB — WET PREP BY MOLECULAR PROBE: Gardnerella vaginalis: POSITIVE — AB

## 2013-03-24 MED ORDER — METRONIDAZOLE 500 MG PO TABS: 500.0000 mg | ORAL_TABLET | Freq: Two times a day (BID) | ORAL | Status: DC

## 2013-03-30 ENCOUNTER — Ambulatory Visit (INDEPENDENT_AMBULATORY_CARE_PROVIDER_SITE_OTHER): Payer: Medicaid Other

## 2013-03-30 ENCOUNTER — Other Ambulatory Visit: Payer: Self-pay | Admitting: Obstetrics

## 2013-03-30 DIAGNOSIS — D259 Leiomyoma of uterus, unspecified: Secondary | ICD-10-CM

## 2013-03-30 DIAGNOSIS — N920 Excessive and frequent menstruation with regular cycle: Secondary | ICD-10-CM

## 2013-03-30 DIAGNOSIS — N938 Other specified abnormal uterine and vaginal bleeding: Secondary | ICD-10-CM

## 2013-03-30 DIAGNOSIS — N925 Other specified irregular menstruation: Secondary | ICD-10-CM

## 2013-03-30 DIAGNOSIS — N946 Dysmenorrhea, unspecified: Secondary | ICD-10-CM

## 2013-03-30 DIAGNOSIS — N949 Unspecified condition associated with female genital organs and menstrual cycle: Secondary | ICD-10-CM

## 2013-03-31 ENCOUNTER — Encounter: Payer: Self-pay | Admitting: Obstetrics

## 2013-04-06 ENCOUNTER — Ambulatory Visit (INDEPENDENT_AMBULATORY_CARE_PROVIDER_SITE_OTHER): Payer: Medicaid Other | Admitting: Obstetrics & Gynecology

## 2013-04-06 ENCOUNTER — Encounter: Payer: Self-pay | Admitting: Obstetrics & Gynecology

## 2013-04-06 DIAGNOSIS — N926 Irregular menstruation, unspecified: Secondary | ICD-10-CM

## 2013-04-06 DIAGNOSIS — R32 Unspecified urinary incontinence: Secondary | ICD-10-CM | POA: Insufficient documentation

## 2013-04-06 DIAGNOSIS — D259 Leiomyoma of uterus, unspecified: Secondary | ICD-10-CM | POA: Insufficient documentation

## 2013-04-06 DIAGNOSIS — N939 Abnormal uterine and vaginal bleeding, unspecified: Secondary | ICD-10-CM | POA: Insufficient documentation

## 2013-04-06 NOTE — Patient Instructions (Addendum)
Urinary Incontinence Urinary incontinence is the involuntary loss of urine from your bladder. CAUSES  There are many causes of urinary incontinence. They include:  Medicines.  Infections.  Prostatic enlargement, leading to overflow of urine from your bladder.  Surgery.  Neurological diseases.  Emotional factors. SIGNS AND SYMPTOMS Urinary Incontinence can be divided into four types: 1. Urge incontinence Urge incontinence is the involuntary loss of urine before you have the opportunity to go to the bathroom. There is an sudden urge to void but not enough time to reach a bathroom. 2. Stress incontinence Stress incontinence is the sudden loss of urine with any activity that forces urine to pass. It is commonly caused by anatomical changes to the pelvis and sphincter areas of your body. 3. Overflow incontinence Overflow incontinence is the loss of urine from an obstructed opening to your bladder. This results in a backup of urine and a resultant buildup of pressure within the bladder. When the pressure within the bladder exceeds the closing pressure of the sphincter, the urine overflows, which causes incontinence, similar to water overflowing a dam. 4. Total incontinence Total incontinence is the loss of urine as a result of the inability to store urine within your bladder. DIAGNOSIS  Evaluating the cause of incontinence may require:  A thorough and complete medical and obstetric history.  A complete physical exam.  Laboratory tests such as a urine culture and sensitivities. When additional tests are indicated, they can include:  An ultrasound exam.  Kidney and bladder X-rays.  Cystoscopy. This is an exam of the bladder using a narrow scope.  Urodynamic testing to test the nerve function to the bladder and sphincter areas. TREATMENT  Treatment for urinary incontinence depends on the cause:  For urge incontinence caused by a bacterial infection, antibiotics will be prescribed. If  the urge incontinence is related to medicines you take, your health care provider may have you change the medicine.  For stress incontinence, surgery to re-establish anatomical support to the bladder or sphincter, or both, will often correct the condition.  For overflow incontinence caused by an enlarged prostate, an operation to open the channel through the enlarged prostate will allow the flow of urine out of the bladder. In women with fibroids, a hysterectomy may be recommended.  For total incontinence, surgery on your urinary sphincter may help. An artificial urinary sphincter (an inflatable cuff placed around the urethra) may be required. In women who have developed a hole-like passage between their bladder and vagina (vesicovaginal fistula), surgery to close the fistula often is required. HOME CARE INSTRUCTIONS  Normal daily hygiene and the use of pads or adult diapers that are changed regularly will help prevent odors and skin damage.  Avoid caffeine. It can overstimulate your bladder.  Use the bathroom regularly. Try about every 2 3 hours to go to the bathroom, even if you do not feel the need to do so. Take time to empty your bladder completely. After urinating, wait a minute. Then try to urinate again.  For causes involving nerve dysfunction, keep a log of the medicines you take and a journal of the times you go to the bathroom. SEEK MEDICAL CARE IF:  You experience worsening of pain instead of improvement in pain after your procedure.  Your incontinence becomes worse instead of better. SEE IMMEDIATE MEDICAL CARE IF:  You experience fever or shaking chills.  You are unable to pass your urine.  You have redness spreading into your groin or down into your thighs. MAKE  SURE YOU:   Understand these instructions.   Will watch your condition.  Will get help right away if you are not doing well or get worse. Document Released: 05/09/2004 Document Revised: 12/02/2012 Document  Reviewed: 09/08/2012 [facility] Patient Information 2014 [place], [place].  Endometrial Ablation Endometrial ablation removes the lining of the uterus (endometrium). It is usually a same-day, outpatient treatment. Ablation helps avoid major surgery, such as surgery to remove the cervix and uterus (hysterectomy). After endometrial ablation, you will have little or no menstrual bleeding and may not be able to have children. However, if you are premenopausal, you will need to use a reliable method of birth control following the procedure because of the small chance that pregnancy can occur. There are different reasons to have this procedure, which include:  Heavy periods.  Bleeding that is causing anemia.  Irregular bleeding.  Bleeding fibroids on the lining inside the uterus if they are smaller than 3 centimeters. This procedure should not be done if:  You want children in the future.  You have severe cramps with your menstrual period.  You have precancerous or cancerous cells in your uterus.  You were recently pregnant.  You have gone through menopause.  You have had major surgery on the uterus, such as a cesarean delivery. LET [facility] CARE PROVIDER KNOW ABOUT:  Any allergies you have.  All medicines you are taking, including vitamins, herbs, eye drops, creams, and over-the-counter medicines.  Previous problems you or members of your family have had with the use of anesthetics.  Any blood disorders you have.  Previous surgeries you have had.  Medical conditions you have. RISKS AND COMPLICATIONS  Generally, this is a safe procedure. However, as with any procedure, complications can occur. Possible complications include:  Perforation of the uterus.  Bleeding.  Infection of the uterus, bladder, or vagina.  Injury to surrounding organs.  An air bubble to the lung (air embolus).  Pregnancy following the procedure.  Failure of the procedure to help the problem,  requiring hysterectomy.  Decreased ability to diagnose cancer in the lining of the uterus. BEFORE THE PROCEDURE  The lining of the uterus must be tested to make sure there is no pre-cancerous or cancer cells present.  An ultrasound may be performed to look at the size of the uterus and to check for abnormalities.  Medicines may be given to thin the lining of the uterus. PROCEDURE  During the procedure, your health care provider will use a tool called a resectoscope to help see inside your uterus. There are different ways to remove the lining of your uterus.   Radiofrequency  This method uses a radiofrequency-alternating electric current to remove the lining of the uterus.  Cryotherapy This method uses extreme cold to freeze the lining of the uterus.  Heated-Free Liquid  This method uses heated salt (saline) solution to remove the lining of the uterus.  Microwave This method uses high-energy microwaves to heat up the lining of the uterus to remove it.  Thermal balloon  This method involves inserting a catheter with a balloon tip into the uterus. The balloon tip is filled with heated fluid to remove the lining of the uterus. AFTER THE PROCEDURE  After your procedure, do not have sexual intercourse or insert anything into your vagina until permitted by your health care provider. After the procedure, you may experience:  Cramps.  Vaginal discharge.  Frequent urination. Document Released: 02/09/2004 Document Revised: 12/02/2012 Document Reviewed: 09/02/2012 [facility] Patient Information 2014 [place]. Uterine Artery Embolization for Fibroids Uterine fibroids are non-cancerous (benign) smooth muscle tumors of the uterus. When they become large, they may produce symptoms of pain and bleeding. Fibroids are sometimes individually removed during surgery or removed with the uterus (hysterectomy).  One non-surgical treatment used to shrink fibroids is called uterine artery embolization. A  specialist (interventional radiologist) uses a thin plastic hose(catheter) to inject material that blocks off the blood supply to the fibroid. In time, this causes the fibroid to shrink. PROCEDURE  Under local anesthetic (a medication that numbs part of the body) the radiologist makes a small cut in the groin. A catheter is then inserted into the main artery of the leg. Using fluoroscopy, your radiologist guides the catheter through the artery to the uterus. A series of images are taken while dye is injected. This is done to provide a road map of the blood supply to the uterus and fibroids. Tiny plastic spheres about the size of sand grains are then injected through the catheter. Metal coils may sometimes also be used to help block the artery. The particles lodge in tiny branches of the uterine artery that supplies blood to the fibroids. The procedure is repeated on the artery that supplies the other side of the uterus. The hospital stay is usually overnight. Normal activity can resume after about a week. Mild pain and cramping following the procedure is easily treated with medication and anti-inflammatory drugs. These usually last only a couple days.  RISKS AND COMPLICATIONS  Injury to the uterus from decreased blood supply may happen.  This could require removal of the uterus (hysterectomy).  Pain and bleeding can occur.  Infection and abscess (a cyst filled with pus).  A cyst filled with blood (hematoma).  Blood infection (septicemia).  Amenorrhea (no menstrual period).  Dying of tissue cells that cannot recover (necrosis) to the bladder or lips of the vagina.  Fistula (a connection between organs or from organ to the skin).  Blood clot in the lung (pulmonary embolus).  Rarely death. EXPECTED OUTCOME An ultrasound or MRI is done in 6 months to make sure the fibroids have shrunk. The fibroids usually shrink to about half their original size. In most cases these effects are long lasting.     The uterus also shrinks but does not die. You may not be able to get pregnant following this procedure.  It cannot be estimated what the effects of the procedure will be on menses. Usually there is less bleeding.  The procedure may cause premature menopause or loss of menstrual cycle. HOME CARE INSTRUCTIONS   Follow your caregiver's advice regarding medications given to you, diet, activity and when to begin sexual activity.  See your caregiver for follow up care as directed.  Do not take aspirin it can cause bleeding. Only take over-the-counter or prescription medicines for pain, discomfort, or fever as directed by your caregiver.  Care for and change dressing as directed. SEEK MEDICAL CARE IF:   You develop a temperature of 102 F (38.9 C) or higher.  There is redness, swelling and pain around the wound.  You have pus draining from the wound.  You develop a rash. SEEK IMMEDIATE MEDICAL CARE IF:   You have bleeding from the wound.  You have difficulty breathing.  You develop chest pain.  You develop belly (abdominal) pain.  You develop leg pain.  You become dizzy and pass out. Document Released: 06/17/2005 Document Revised: 06/24/2011 Document Reviewed: 10/15/2012 [facility] Patient Information 2014 [place], [place].  Fibroids Fibroids are lumps (tumors) that can occur any place in a woman's body. These lumps are not cancerous. Fibroids vary in size, weight, and where they grow. HOME CARE  Do not take aspirin.  Write down the number of pads or tampons you use during your period. Tell your doctor. This can help determine the best treatment for you. GET HELP RIGHT AWAY IF:  You have pain in your lower belly (abdomen) that is not helped with medicine.  You have cramps that are not helped with medicine.  You have more bleeding between or during your period.  You feel lightheaded or pass out (faint).  Your lower belly pain gets worse. MAKE SURE YOU:  Understand  these instructions.  Will watch your condition.  Will get help right away if you are not doing well or get worse. Document Released: 05/04/2010 Document Revised: 06/24/2011 Document Reviewed: 05/04/2010 [facility] Patient Information 2014 [place], [place].

## 2013-04-06 NOTE — Progress Notes (Signed)
Subjective:     Beth Cole is a 52 y.o. female here for a follow up exam.  Current complaints: pt states that her bleeding has stopped since last visit.  Pt states that she is worried about painful intercourse.  Pt is also having some urinary incontinence when coughing.  Personal health questionnaire reviewed: yes.   Gynecologic History No LMP recorded. Contraception: none Last Pap: 03/23/13. Results were: normal   Obstetric History OB History  Gravida Para Term Preterm AB SAB TAB Ectopic Multiple Living  6 3 2 1 3  3   3     # Outcome Date GA Lbr Len/2nd Weight Sex Delivery Anes PTL Lv  6 TRM           5 TRM           4 PRE           3 TAB           2 TAB           1 TAB                The following portions of the patient's history were reviewed and updated as appropriate: allergies, current medications, past family history, past medical history, past social history, past surgical history and problem list.  Review of Systems Pertinent items are noted in HPI.    Objective:  Pelvic:  Positive anal wink; no cystocele/rectocele/uterine prolapse; negative stress test; no urethral hypermobility; good levator tone, normal external anal sphincter tone  PVR--20 ml Bladder capacity at least 300 ml w/mild urge to urinate   Assessment:   Urinary incontinence--symptoms c/w stress  AUB--L Plan:   Treatment options reviewed Keep voiding diary Referral to Urology for PT Return in 1 mth with Dr. Clearance Coots

## 2013-04-06 NOTE — Addendum Note (Signed)
Addended by: Elby Beck F on: 04/06/2013 03:26 PM   Modules accepted: Orders

## 2013-04-07 LAB — URINALYSIS, ROUTINE W REFLEX MICROSCOPIC
Bilirubin Urine: NEGATIVE
Leukocytes, UA: NEGATIVE
Nitrite: NEGATIVE
Protein, ur: NEGATIVE mg/dL
Urobilinogen, UA: 0.2 mg/dL (ref 0.0–1.0)

## 2013-04-07 LAB — URINE CULTURE: Colony Count: NO GROWTH

## 2013-04-16 ENCOUNTER — Encounter: Payer: Self-pay | Admitting: Obstetrics

## 2013-04-19 ENCOUNTER — Encounter: Payer: Self-pay | Admitting: Obstetrics

## 2013-04-20 ENCOUNTER — Emergency Department (HOSPITAL_COMMUNITY)
Admission: EM | Admit: 2013-04-20 | Discharge: 2013-04-20 | Disposition: A | Discharge status: Home / Self Care | Payer: Medicaid Other | Attending: Emergency Medicine | Admitting: Emergency Medicine

## 2013-04-20 ENCOUNTER — Encounter (HOSPITAL_COMMUNITY): Payer: Self-pay | Admitting: Emergency Medicine

## 2013-04-20 ENCOUNTER — Emergency Department (HOSPITAL_COMMUNITY): Payer: Medicaid Other

## 2013-04-20 DIAGNOSIS — M719 Bursopathy, unspecified: Secondary | ICD-10-CM | POA: Insufficient documentation

## 2013-04-20 DIAGNOSIS — F411 Generalized anxiety disorder: Secondary | ICD-10-CM | POA: Insufficient documentation

## 2013-04-20 DIAGNOSIS — M7520 Bicipital tendinitis, unspecified shoulder: Secondary | ICD-10-CM | POA: Insufficient documentation

## 2013-04-20 DIAGNOSIS — S46909A Unspecified injury of unspecified muscle, fascia and tendon at shoulder and upper arm level, unspecified arm, initial encounter: Secondary | ICD-10-CM | POA: Insufficient documentation

## 2013-04-20 DIAGNOSIS — Z8742 Personal history of other diseases of the female genital tract: Secondary | ICD-10-CM | POA: Insufficient documentation

## 2013-04-20 DIAGNOSIS — Z79899 Other long term (current) drug therapy: Secondary | ICD-10-CM | POA: Insufficient documentation

## 2013-04-20 DIAGNOSIS — F3289 Other specified depressive episodes: Secondary | ICD-10-CM | POA: Insufficient documentation

## 2013-04-20 DIAGNOSIS — S4980XA Other specified injuries of shoulder and upper arm, unspecified arm, initial encounter: Secondary | ICD-10-CM | POA: Insufficient documentation

## 2013-04-20 DIAGNOSIS — F329 Major depressive disorder, single episode, unspecified: Secondary | ICD-10-CM | POA: Insufficient documentation

## 2013-04-20 DIAGNOSIS — G8929 Other chronic pain: Secondary | ICD-10-CM | POA: Insufficient documentation

## 2013-04-20 DIAGNOSIS — F17200 Nicotine dependence, unspecified, uncomplicated: Secondary | ICD-10-CM | POA: Insufficient documentation

## 2013-04-20 DIAGNOSIS — M7581 Other shoulder lesions, right shoulder: Secondary | ICD-10-CM

## 2013-04-20 DIAGNOSIS — Z87448 Personal history of other diseases of urinary system: Secondary | ICD-10-CM | POA: Insufficient documentation

## 2013-04-20 DIAGNOSIS — M67919 Unspecified disorder of synovium and tendon, unspecified shoulder: Secondary | ICD-10-CM | POA: Insufficient documentation

## 2013-04-20 DIAGNOSIS — K219 Gastro-esophageal reflux disease without esophagitis: Secondary | ICD-10-CM | POA: Insufficient documentation

## 2013-04-20 DIAGNOSIS — IMO0002 Reserved for concepts with insufficient information to code with codable children: Secondary | ICD-10-CM | POA: Insufficient documentation

## 2013-04-20 DIAGNOSIS — M7521 Bicipital tendinitis, right shoulder: Secondary | ICD-10-CM

## 2013-04-20 MED ORDER — NAPROXEN 500 MG PO TABS: 500.0000 mg | ORAL_TABLET | Freq: Two times a day (BID) | ORAL | Status: DC

## 2013-04-20 MED ORDER — TRAMADOL HCL 50 MG PO TABS: 50.0000 mg | ORAL_TABLET | Freq: Four times a day (QID) | ORAL | Status: DC | PRN

## 2013-04-20 MED ORDER — KETOROLAC TROMETHAMINE 60 MG/2ML IM SOLN
60.0000 mg | Freq: Once | INTRAMUSCULAR | Status: AC
  Administered 2013-04-20: 60 mg via INTRAMUSCULAR
  Filled 2013-04-20: qty 2
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
## 2013-04-20 NOTE — Discharge Instructions (Signed)
Follow up with orthopedics in 2 days for reevaluation/further workup as needed. Take medications as directed.    Emergency Department Resource Guide 1) Find a Doctor and Pay Out of Pocket Although you won't have to find out who is covered by your insurance plan, it is a good idea to ask around and get recommendations. You will then need to call the office and see if the doctor you have chosen will accept you as a new patient and what types of options they offer for patients who are self-pay. Some doctors offer discounts or will set up payment plans for their patients who do not have insurance, but you will need to ask so you aren't surprised when you get to your appointment.  2) Contact Your Local Health Department Not all health departments have doctors that can see patients for sick visits, but many do, so it is worth a call to see if yours does. If you don't know where your local health department is, you can check in your phone book. The CDC also has a tool to help you locate your state's health department, and many state websites also have listings of all of their local health departments.  3) Find a [facility] If your illness is not likely to be very severe or complicated, you may want to try a walk in clinic. These are popping up all over the country in pharmacies, drugstores, and shopping centers. They're usually staffed by nurse practitioners or physician assistants that have been trained to treat common illnesses and complaints. They're usually fairly quick and inexpensive. However, if you have serious medical issues or chronic medical problems, these are probably not your best option.  No Primary Care Doctor: - Call Health Connect at  [phone] - they can help you locate a primary care doctor that  accepts your insurance, provides certain services, etc. - Physician Referral Service- [phone]  Chronic Pain Problems: Organization         Address  Phone   Notes  [facility]  [phone] Patients need to be referred by their primary care doctor.   Medication Assistance: Organization         Address  Phone   Notes  [facility] Medication [facility] [address] [place], [place] [address] [phone] --Must be a resident of [facility] -- Must have NO insurance coverage whatsoever (no Medicaid/ Medicare, etc.) -- The pt. MUST have a primary care doctor that directs their care regularly and follows them in the community   MedAssist  [phone]   [employer]  [phone]    Agencies that provide inexpensive medical care: Organization         Address  Phone   Notes  [place]  [phone]   [facility]    [phone]   [facility] [place], [place] [address] [phone]   [place] [address], [place] [phone]   Planned Parenthood    [phone]   [facility]    [phone]   [place] and [address], [place] Phone:  [phone], Fax:  [phone] Hours of Operation:  9 am - 6 pm, M-F.  Also accepts Medicaid/Medicare and self-pay.  [facility] for [address], [address],  Phone: [phone], Fax: [phone]. Hours of  Operation:  8:30 am - 5:30 pm, M-F.  Also accepts Medicaid and self-pay.  [facility] [place] [address], [place] Phone: [phone]   [place], [place], [place] [phone], Ext. [phone] Mondays & Thursdays: 7-9 AM.  First 15 patients are seen on a first come, first serve basis.    [place] Providers:  Organization         Address  Phone   Notes  [facility] [address], [place] [phone] Also accepts self-pay patients.  [facility] [id number] [place], [place]  [phone]   [place], [address], [place] [phone]   [facility] [address] [place] [phone]   Lucianne Lei [address], [place]   [phone] Only accepts [place] Access [place] patients after they have their name applied to their card.   Self-Pay (no insurance) in [facility]:  Organization         Address  Phone   Notes  Sickle Cell Patients, [facility] [place] [phone]   [facility] [phone]   [facility] [place], [place], [place] [phone]   [facility]/Dr. Osei-Bonsu  [address] [place] or [address], [place] [phone] Phone number for both [place] and [place] locations is the same.  Urgent Medical and [facility] [address] [place] [phone]   [facility] [address] [place] or [address] [phone]   [facility] [address], [place] [phone], phone; [phone], fax Sees patients 1st and 3rd Saturday of every month.  Must not qualify for public or private insurance (i.e. Medicaid, Medicare, [place] Health Choice, Veterans' Benefits)  Household income should be no more than 200% of the poverty level The clinic cannot treat you if you are pregnant or think you are pregnant  Sexually transmitted diseases are not treated at the clinic.    Dental Care: Organization         Address  Phone  Notes  [facility] Department of [facility] [place] [phone] Accepts children up to age 37 who are enrolled in [place] or [place]; pregnant women with a Medicaid card; and children who have applied for Medicaid  or [place] Health Choice, but were declined, whose parents can pay a reduced fee at time of service.  [facility]  [address], [place] [phone] Accepts children up to age 49 who are enrolled in [place] or [place]; pregnant women with a Medicaid card; and children who have applied for Medicaid or [place] Health Choice, but were declined, whose parents can pay a reduced fee at time of service.  [facility] Adult Dental Access PROGRAM  [place] [phone] Patients are seen by appointment only. Walk-ins are not accepted. [place] will see patients 1 years of age and older. Monday - Tuesday (8am-5pm) Most Wednesdays (8:30-5pm) $30 per visit, cash only  [facility] Adult Dental Access PROGRAM  [address], [facility] [phone] Patients are seen  by appointment only. Walk-ins are not accepted. [place] will see patients 55 years of age and older. One Wednesday Evening (Monthly: Volunteer Based).  $30 per visit, cash only  [place]  [phone] for adults; Children under age 37, call Graduate Pediatric Dentistry at [phone]. Children aged 69-14, please call [phone] to request a pediatric application.  Dental services are provided in all areas of dental care including fillings, crowns and bridges, complete and partial dentures, implants, gum treatment, root canals, and extractions. Preventive care is also provided. Treatment is provided to both adults and children. Patients are selected via a lottery and there is often a waiting list.   [facility] [address] [place]  [phone] [email or web address]   [facility] [address] [place], [place] [phone], Ext. [phone] Second and Fourth Thursday of each month, opens at 6:30 AM; Clinic ends at 9 AM.  Patients are seen on a first-come first-served basis, and a limited number are seen  during each clinic.   [facility]  [address] Hillard Danker [place], [place] [phone]   Eligibility Requirements You must have lived in [place], [place], or [place] counties for at least the last three months.   You cannot be eligible for state or federal sponsored [employer], including [employer], [place], or [employer].   You generally cannot be eligible for healthcare insurance through your employer.    How to apply: Eligibility screenings are held every Tuesday and Wednesday afternoon from 1:00 pm until 4:00 pm. You do not need an appointment for the interview!  [facility] [address] [place], [place]   [place]  [place] Department  [place]  [phone]    Behavioral Health Resources in the Community: Intensive Outpatient Programs Organization         Address  Phone  Notes  [facility] [place]. [address], [place], [place] [phone]   [facility] Outpatient [address], [place], [place]   ADS: Alcohol & Drug Svcs [address] [place], [place]   [place] [address],  [place], [place] or [phone]   Substance Abuse Resources Organization         Address  Phone  Notes  Alcohol and Drug Services  9304227473   [place]  [phone]   [facility] [place]   Chinita Pester  [phone]   Residential & Outpatient Substance Abuse Program  [phone]   Psychological Services Organization         Address  Phone  Notes  [facility] [place]  [place]  [phone]   [place] [address], [place] or [phone]    Mobile Crisis Teams Organization         Address  Phone  Notes  [facility],  Mobile Crisis Care Unit  [phone]   Assertive Psychotherapeutic Services  [address] [place] [address] [place], [place]   [place] [address] [place] [phone]    Self-Help/Support Groups Organization         Address  Phone             Notes  [place]. of [place] - variety of support groups  [place] Call for more  information  Narcotics Anonymous (NA), [facility] [address], [place]  2 meetings at this location   Residential Facilities manager         Address  Phone  Notes  [facility] [place],    [place]  [phone]   [facility]  [address], [place] [phone], [place], [place]   [place], [place] [phone] Admissions: 8am-3pm M-F  Incentives Substance [place] [address],    [place], [place] [phone]   [facility] [address] [place], [place], [place]   [facility] [address]  [place], [place]   [facility] Programs - Intensive Outpatient [address] Kristeen [address], [place], [place]   [facility] ([place].,  [place], [place] [phone] or [phone]   [facility]idential Treatment Services (RTS) [address], [place], [place] Accepts Medicaid  [facility] [place] [address],  [place] [phone] Substance Abuse/Addiction Treatment   [facility] Organization         Address  Phone  Notes  [facility]  [phone]   Domenic Schwab, PhD [address] Arlis Port[place], [place]   [phone] or [phone]   [place], [place] [phone]   [facility] [address], [place], [place] [phone] Insurance/Medicaid/sponsorship through [facility] and Families [address],  [address]                                    [place], [place] [phone] [place] [address], [place] [phone]    Dr. Adele Schilder  [phone]   [facility] Dept. 1) [address], [place] 2) [place] 3)  [place] [address], [place] [phone]  [phone]   [place] [phone] or [phone] (After Hours)
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
## 2013-04-23 NOTE — ED Provider Notes (Signed)
Medical screening examination/treatment/procedure(s) were performed by non-physician practitioner and as supervising physician I was immediately available for consultation/collaboration.  EKG Interpretation   None          Neta Ehlers, MD 04/23/13 1014

## 2013-04-28 ENCOUNTER — Other Ambulatory Visit: Payer: Self-pay | Admitting: Obstetrics

## 2013-04-28 DIAGNOSIS — N946 Dysmenorrhea, unspecified: Secondary | ICD-10-CM | POA: Insufficient documentation

## 2013-04-28 MED ORDER — OXYCODONE-ACETAMINOPHEN 5-325 MG PO TABS: 1.0000 | ORAL_TABLET | Freq: Four times a day (QID) | ORAL | Status: DC | PRN

## 2013-05-06 ENCOUNTER — Ambulatory Visit (INDEPENDENT_AMBULATORY_CARE_PROVIDER_SITE_OTHER): Payer: Medicaid Other | Admitting: Obstetrics

## 2013-05-06 ENCOUNTER — Encounter: Payer: Self-pay | Admitting: Obstetrics

## 2013-05-06 VITALS — BP 121/85 | HR 102 | Temp 98.2°F | Wt 151.0 lb

## 2013-05-06 DIAGNOSIS — N920 Excessive and frequent menstruation with regular cycle: Secondary | ICD-10-CM | POA: Insufficient documentation

## 2013-05-06 DIAGNOSIS — N949 Unspecified condition associated with female genital organs and menstrual cycle: Secondary | ICD-10-CM | POA: Insufficient documentation

## 2013-05-06 NOTE — Progress Notes (Signed)
Subjective:     Beth Cole is a 53 y.o. female here for a routine exam.  Current complaints: Pt states her bleeding is still a problem. Pt had her cycle this month for 2 weeks.  Pt states that she would like to try something different for pain. Pt states that the Percocet is making her drowsy.  Personal health questionnaire reviewed: yes.   Gynecologic History Patient's last menstrual period was 04/05/2013.   Obstetric History OB History  Gravida Para Term Preterm AB SAB TAB Ectopic Multiple Living  6 3 2 1 3  3   3     # Outcome Date GA Lbr Len/2nd Weight Sex Delivery Anes PTL Lv  6 TRM           5 TRM           4 PRE           3 TAB           2 TAB           1 TAB                The following portions of the patient's history were reviewed and updated as appropriate: allergies, current medications, past family history, past medical history, past social history, past surgical history and problem list.  Review of Systems Pertinent items are noted in HPI.    Objective:    No exam performed today, Consult only..    Assessment:    Chronic pain syndrome.  Dyspareunia.   Plan:    Education reviewed: Management of chronic pain.[place] Kitchen

## 2013-05-31 ENCOUNTER — Ambulatory Visit (INDEPENDENT_AMBULATORY_CARE_PROVIDER_SITE_OTHER): Payer: Medicaid Other | Admitting: Obstetrics

## 2013-05-31 ENCOUNTER — Encounter: Payer: Self-pay | Admitting: Obstetrics

## 2013-05-31 VITALS — BP 132/88 | HR 95 | Ht 62.0 in | Wt 155.0 lb

## 2013-05-31 DIAGNOSIS — N946 Dysmenorrhea, unspecified: Secondary | ICD-10-CM

## 2013-05-31 DIAGNOSIS — D259 Leiomyoma of uterus, unspecified: Secondary | ICD-10-CM

## 2013-05-31 MED ORDER — OXYCODONE HCL 10 MG PO TABS: 10.0000 mg | ORAL_TABLET | Freq: Four times a day (QID) | ORAL | Status: DC | PRN

## 2013-05-31 NOTE — Progress Notes (Signed)
Subjective:     Beth Cole is a 53 y.o. female here for a routine exam.  Current complaints: pain during intercourse. Patient states she is in office requesting a refill on her Percocet.  Personal health questionnaire reviewed: yes.   Gynecologic History No LMP recorded. Contraception: tubal ligation  Obstetric History OB History  Gravida Para Term Preterm AB SAB TAB Ectopic Multiple Living  6 3 2 1 3  3   3     # Outcome Date GA Lbr Len/2nd Weight Sex Delivery Anes PTL Lv  6 TRM           5 TRM           4 PRE           3 TAB           2 TAB           1 TAB                The following portions of the patient's history were reviewed and updated as appropriate: allergies, current medications, past family history, past medical history, past social history, past surgical history and problem list.  Review of Systems Pertinent items are noted in HPI.    Objective:    General appearance: alert and no distress Abdomen: normal findings: soft, non-tender Pelvic: cervix normal in appearance, external genitalia normal, no adnexal masses or tenderness, no cervical motion tenderness, rectovaginal septum normal, uterus normal size, shape, and consistency and vagina normal without discharge    Assessment:    Healthy female exam.   Dysmenorrhea   Plan:    Education reviewed: Management of dysmenorrhea. Oxycodone Rx

## 2013-06-01 ENCOUNTER — Ambulatory Visit: Payer: Medicaid Other | Admitting: Obstetrics

## 2013-06-28 ENCOUNTER — Ambulatory Visit (INDEPENDENT_AMBULATORY_CARE_PROVIDER_SITE_OTHER): Payer: Medicaid Other | Admitting: Obstetrics

## 2013-06-28 VITALS — BP 133/92 | HR 93 | Wt 156.0 lb

## 2013-06-28 DIAGNOSIS — N946 Dysmenorrhea, unspecified: Secondary | ICD-10-CM

## 2013-06-28 MED ORDER — OXYCODONE HCL 10 MG PO TABS: 10.0000 mg | ORAL_TABLET | Freq: Four times a day (QID) | ORAL | Status: DC | PRN

## 2013-06-28 NOTE — Progress Notes (Signed)
Subjective:     Beth Cole is a 53 y.o. female here for a routine exam.  Current complaints: pt is in office to follow up from previous visits.  Pt states that she is having lots of pain with cycle and intercourse. Pt would like to know if she would benefit from a D&C. Pt states that she feels as though that she is not a functioning person.  Pt feels as though she isn't the same.  Personal health questionnaire reviewed: yes.   Gynecologic History No LMP recorded.     Obstetric History OB History  Gravida Para Term Preterm AB SAB TAB Ectopic Multiple Living  6 3 2 1 3  3   3     # Outcome Date GA Lbr Len/2nd Weight Sex Delivery Anes PTL Lv  6 TRM           5 TRM           4 PRE           3 TAB           2 TAB           1 TAB                The following portions of the patient's history were reviewed and updated as appropriate: allergies, current medications, past family history, past medical history, past social history, past surgical history and problem list.  Review of Systems Pertinent items are noted in HPI.    Objective:    No exam performed today, Consult only.    Assessment:    Chronic Pelvic Pain.   Plan:    Education reviewed: Management of Chronic Pelvic Pain. Follow up in: several months. Needs referral to Specialty [facility] for Chronic Pelvic Pain.   Patient instructed that we cannot provide long term management of Chronic Pelvic Pain.

## 2013-06-29 ENCOUNTER — Encounter: Payer: Self-pay | Admitting: Obstetrics

## 2013-07-29 ENCOUNTER — Ambulatory Visit: Payer: Medicaid Other | Admitting: Obstetrics

## 2013-08-04 ENCOUNTER — Ambulatory Visit: Payer: Medicaid Other | Admitting: Obstetrics

## 2013-08-09 ENCOUNTER — Ambulatory Visit: Payer: Medicaid Other | Admitting: Obstetrics

## 2013-08-18 ENCOUNTER — Other Ambulatory Visit: Payer: Self-pay | Admitting: Internal Medicine

## 2013-08-18 DIAGNOSIS — Z1231 Encounter for screening mammogram for malignant neoplasm of breast: Secondary | ICD-10-CM

## 2013-08-26 ENCOUNTER — Ambulatory Visit
Admission: RE | Admit: 2013-08-26 | Discharge: 2013-08-26 | Disposition: A | Discharge status: Home / Self Care | Payer: Medicaid Other | Source: Ambulatory Visit | Attending: Internal Medicine | Admitting: Internal Medicine

## 2013-08-26 ENCOUNTER — Encounter (INDEPENDENT_AMBULATORY_CARE_PROVIDER_SITE_OTHER): Payer: Self-pay

## 2013-08-26 DIAGNOSIS — Z1231 Encounter for screening mammogram for malignant neoplasm of breast: Secondary | ICD-10-CM

## 2013-09-13 ENCOUNTER — Encounter: Payer: Self-pay | Admitting: Obstetrics

## 2013-09-13 ENCOUNTER — Ambulatory Visit (INDEPENDENT_AMBULATORY_CARE_PROVIDER_SITE_OTHER): Payer: Medicaid Other | Admitting: Obstetrics

## 2013-09-13 VITALS — BP 119/82 | HR 109 | Temp 98.6°F | Ht 62.0 in | Wt 152.0 lb

## 2013-09-13 DIAGNOSIS — B9689 Other specified bacterial agents as the cause of diseases classified elsewhere: Secondary | ICD-10-CM | POA: Insufficient documentation

## 2013-09-13 DIAGNOSIS — N946 Dysmenorrhea, unspecified: Secondary | ICD-10-CM

## 2013-09-13 DIAGNOSIS — N760 Acute vaginitis: Secondary | ICD-10-CM | POA: Insufficient documentation

## 2013-09-13 DIAGNOSIS — N951 Menopausal and female climacteric states: Secondary | ICD-10-CM

## 2013-09-13 DIAGNOSIS — A499 Bacterial infection, unspecified: Secondary | ICD-10-CM

## 2013-09-13 MED ORDER — OXYCODONE HCL 10 MG PO TABS: 10.0000 mg | ORAL_TABLET | Freq: Four times a day (QID) | ORAL | Status: DC | PRN

## 2013-09-13 NOTE — Addendum Note (Signed)
Addended by: Valli Glance F on: 09/13/2013 03:04 PM   Modules accepted: Orders
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
## 2013-09-14 LAB — WET PREP BY MOLECULAR PROBE
CANDIDA SPECIES: NEGATIVE
Gardnerella vaginalis: POSITIVE — AB
TRICHOMONAS VAG: POSITIVE — AB

## 2013-09-14 LAB — GC/CHLAMYDIA PROBE AMP
CT PROBE, AMP APTIMA: NEGATIVE
GC PROBE AMP APTIMA: NEGATIVE

## 2013-09-16 ENCOUNTER — Telehealth: Payer: Self-pay | Admitting: *Deleted

## 2013-09-16 ENCOUNTER — Other Ambulatory Visit: Payer: Self-pay | Admitting: *Deleted

## 2013-09-16 DIAGNOSIS — B9689 Other specified bacterial agents as the cause of diseases classified elsewhere: Secondary | ICD-10-CM

## 2013-09-16 DIAGNOSIS — N760 Acute vaginitis: Secondary | ICD-10-CM

## 2013-09-16 DIAGNOSIS — A599 Trichomoniasis, unspecified: Secondary | ICD-10-CM

## 2013-09-16 MED ORDER — METRONIDAZOLE 500 MG PO TABS: 500.0000 mg | ORAL_TABLET | Freq: Two times a day (BID) | ORAL | Status: DC

## 2013-09-16 MED ORDER — TINIDAZOLE 500 MG PO TABS: 2.0000 g | ORAL_TABLET | Freq: Every day | ORAL | Status: DC

## 2013-09-16 NOTE — Telephone Encounter (Signed)
Patient called regarding lab results. Patient notified of lab results. Tindamax and Metronidazole sent to pharmacy. Patient advised to take the Tindamax first and then the metronidazole. Patient advised that her patient needed to be treated as well and that they both needed to abstain from sexual intercourse for 2 weeks. Patient also advised that she needed to come back in for Test of Cure in Three months. Patient verbalized understanding.

## 2013-11-03 ENCOUNTER — Ambulatory Visit (INDEPENDENT_AMBULATORY_CARE_PROVIDER_SITE_OTHER): Payer: Medicaid Other | Admitting: Obstetrics

## 2013-11-03 ENCOUNTER — Encounter: Payer: Self-pay | Admitting: Obstetrics

## 2013-11-03 DIAGNOSIS — N760 Acute vaginitis: Secondary | ICD-10-CM

## 2013-11-03 DIAGNOSIS — N949 Unspecified condition associated with female genital organs and menstrual cycle: Secondary | ICD-10-CM

## 2013-11-03 DIAGNOSIS — R102 Pelvic and perineal pain: Secondary | ICD-10-CM

## 2013-11-03 DIAGNOSIS — N946 Dysmenorrhea, unspecified: Secondary | ICD-10-CM

## 2013-11-03 MED ORDER — OXYCODONE HCL 10 MG PO TABS: 10.0000 mg | ORAL_TABLET | Freq: Four times a day (QID) | ORAL | Status: DC | PRN

## 2013-11-03 MED ORDER — TINIDAZOLE 500 MG PO TABS: 2.0000 g | ORAL_TABLET | Freq: Every day | ORAL | Status: DC
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
## 2013-11-04 LAB — WET PREP BY MOLECULAR PROBE
CANDIDA SPECIES: NEGATIVE
GARDNERELLA VAGINALIS: NEGATIVE
Trichomonas vaginosis: NEGATIVE

## 2013-11-04 LAB — GC/CHLAMYDIA PROBE AMP
CT PROBE, AMP APTIMA: NEGATIVE
GC Probe RNA: NEGATIVE

## 2013-11-25 ENCOUNTER — Encounter (HOSPITAL_COMMUNITY): Payer: Self-pay | Admitting: Emergency Medicine

## 2013-11-25 ENCOUNTER — Emergency Department (HOSPITAL_COMMUNITY)
Admission: EM | Admit: 2013-11-25 | Discharge: 2013-11-25 | Disposition: A | Discharge status: Home / Self Care | Payer: Medicaid Other | Attending: Emergency Medicine | Admitting: Emergency Medicine

## 2013-11-25 DIAGNOSIS — Z87448 Personal history of other diseases of urinary system: Secondary | ICD-10-CM | POA: Diagnosis not present

## 2013-11-25 DIAGNOSIS — Z79899 Other long term (current) drug therapy: Secondary | ICD-10-CM | POA: Insufficient documentation

## 2013-11-25 DIAGNOSIS — F411 Generalized anxiety disorder: Secondary | ICD-10-CM | POA: Diagnosis not present

## 2013-11-25 DIAGNOSIS — Z8742 Personal history of other diseases of the female genital tract: Secondary | ICD-10-CM | POA: Diagnosis not present

## 2013-11-25 DIAGNOSIS — IMO0002 Reserved for concepts with insufficient information to code with codable children: Secondary | ICD-10-CM | POA: Insufficient documentation

## 2013-11-25 DIAGNOSIS — M270 Developmental disorders of jaws: Secondary | ICD-10-CM

## 2013-11-25 DIAGNOSIS — M278 Other specified diseases of jaws: Secondary | ICD-10-CM | POA: Diagnosis not present

## 2013-11-25 DIAGNOSIS — J029 Acute pharyngitis, unspecified: Secondary | ICD-10-CM | POA: Diagnosis present

## 2013-11-25 DIAGNOSIS — F3289 Other specified depressive episodes: Secondary | ICD-10-CM | POA: Insufficient documentation

## 2013-11-25 DIAGNOSIS — F17200 Nicotine dependence, unspecified, uncomplicated: Secondary | ICD-10-CM | POA: Insufficient documentation

## 2013-11-25 DIAGNOSIS — K219 Gastro-esophageal reflux disease without esophagitis: Secondary | ICD-10-CM | POA: Insufficient documentation

## 2013-11-25 DIAGNOSIS — F329 Major depressive disorder, single episode, unspecified: Secondary | ICD-10-CM | POA: Insufficient documentation

## 2013-11-25 DIAGNOSIS — G8929 Other chronic pain: Secondary | ICD-10-CM | POA: Insufficient documentation

## 2013-11-25 LAB — RAPID STREP SCREEN (MED CTR MEBANE ONLY): Streptococcus, Group A Screen (Direct): NEGATIVE

## 2013-11-25 NOTE — ED Notes (Addendum)
Pt c/o lt sided throat pain and pain when swallowing.  Tried to go to her PCP but they could not take any walk ins.  C/o pain x 2 and a half weeks.  Pt also states her stomach hurts but attributes that to not eating.

## 2013-11-25 NOTE — ED Notes (Signed)
MD at bedside. 
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
## 2013-11-27 LAB — CULTURE, GROUP A STREP

## 2013-12-21 ENCOUNTER — Other Ambulatory Visit (HOSPITAL_COMMUNITY): Payer: Self-pay | Admitting: Internal Medicine

## 2013-12-21 DIAGNOSIS — M79606 Pain in leg, unspecified: Secondary | ICD-10-CM

## 2013-12-22 ENCOUNTER — Ambulatory Visit (HOSPITAL_COMMUNITY): Payer: Self-pay

## 2013-12-22 ENCOUNTER — Ambulatory Visit (HOSPITAL_COMMUNITY)
Admission: RE | Admit: 2013-12-22 | Discharge: 2013-12-22 | Disposition: A | Discharge status: Home / Self Care | Payer: Medicaid Other | Source: Ambulatory Visit | Attending: Vascular Surgery | Admitting: Vascular Surgery

## 2013-12-22 DIAGNOSIS — M79609 Pain in unspecified limb: Secondary | ICD-10-CM | POA: Insufficient documentation

## 2013-12-22 DIAGNOSIS — M7989 Other specified soft tissue disorders: Secondary | ICD-10-CM

## 2013-12-22 DIAGNOSIS — M79606 Pain in leg, unspecified: Secondary | ICD-10-CM

## 2013-12-22 NOTE — Progress Notes (Signed)
VASCULAR LAB PRELIMINARY  PRELIMINARY  PRELIMINARY  PRELIMINARY  Bilateral lower extremity venous duplex  completed.    Preliminary report:  Bilateral:  No evidence of DVT, superficial thrombosis, or Baker's Cyst.    Dmario Russom, RVT 12/22/2013, 11:15 AM

## 2013-12-28 ENCOUNTER — Ambulatory Visit (INDEPENDENT_AMBULATORY_CARE_PROVIDER_SITE_OTHER): Payer: Medicaid Other | Admitting: Obstetrics

## 2013-12-28 ENCOUNTER — Encounter: Payer: Self-pay | Admitting: Obstetrics

## 2013-12-28 DIAGNOSIS — N949 Unspecified condition associated with female genital organs and menstrual cycle: Secondary | ICD-10-CM

## 2013-12-28 DIAGNOSIS — N946 Dysmenorrhea, unspecified: Secondary | ICD-10-CM

## 2013-12-28 MED ORDER — IBUPROFEN 800 MG PO TABS: 800.0000 mg | ORAL_TABLET | Freq: Three times a day (TID) | ORAL | Status: DC | PRN
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
## 2014-02-14 ENCOUNTER — Encounter: Payer: Self-pay | Admitting: Obstetrics

## 2014-03-02 ENCOUNTER — Emergency Department (HOSPITAL_COMMUNITY)
Admission: EM | Admit: 2014-03-02 | Discharge: 2014-03-02 | Disposition: A | Discharge status: Home / Self Care | Payer: Medicaid Other | Attending: Emergency Medicine | Admitting: Emergency Medicine

## 2014-03-02 ENCOUNTER — Encounter (HOSPITAL_COMMUNITY): Payer: Self-pay | Admitting: *Deleted

## 2014-03-02 DIAGNOSIS — G8929 Other chronic pain: Secondary | ICD-10-CM | POA: Insufficient documentation

## 2014-03-02 DIAGNOSIS — Z87448 Personal history of other diseases of urinary system: Secondary | ICD-10-CM | POA: Insufficient documentation

## 2014-03-02 DIAGNOSIS — K219 Gastro-esophageal reflux disease without esophagitis: Secondary | ICD-10-CM | POA: Diagnosis not present

## 2014-03-02 DIAGNOSIS — Z8669 Personal history of other diseases of the nervous system and sense organs: Secondary | ICD-10-CM | POA: Insufficient documentation

## 2014-03-02 DIAGNOSIS — J029 Acute pharyngitis, unspecified: Secondary | ICD-10-CM | POA: Diagnosis not present

## 2014-03-02 DIAGNOSIS — Z8739 Personal history of other diseases of the musculoskeletal system and connective tissue: Secondary | ICD-10-CM | POA: Insufficient documentation

## 2014-03-02 DIAGNOSIS — Z79899 Other long term (current) drug therapy: Secondary | ICD-10-CM | POA: Diagnosis not present

## 2014-03-02 DIAGNOSIS — R220 Localized swelling, mass and lump, head: Secondary | ICD-10-CM | POA: Diagnosis present

## 2014-03-02 DIAGNOSIS — Z9889 Other specified postprocedural states: Secondary | ICD-10-CM | POA: Insufficient documentation

## 2014-03-02 DIAGNOSIS — Z720 Tobacco use: Secondary | ICD-10-CM | POA: Diagnosis not present

## 2014-03-02 DIAGNOSIS — Z8659 Personal history of other mental and behavioral disorders: Secondary | ICD-10-CM | POA: Diagnosis not present

## 2014-03-02 MED ORDER — DM-GUAIFENESIN ER 30-600 MG PO TB12: 1.0000 | ORAL_TABLET | Freq: Two times a day (BID) | ORAL | Status: DC

## 2014-03-02 MED ORDER — DM-GUAIFENESIN ER 30-600 MG PO TB12
1.0000 | ORAL_TABLET | Freq: Two times a day (BID) | ORAL | Status: DC
  Filled 2014-03-02: qty 1

## 2014-03-02 MED ORDER — LIDOCAINE VISCOUS 2 % MT SOLN: 20.0000 mL | OROMUCOSAL | Status: DC | PRN

## 2014-03-02 NOTE — ED Notes (Signed)
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 Reports having increase in secretions from her mouth and feels like her throat is clogged every morning when she wakes up and that its becoming more difficult to swallow. Airway intact at triage.

## 2014-03-02 NOTE — Discharge Instructions (Signed)
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
## 2014-03-24 ENCOUNTER — Encounter (HOSPITAL_COMMUNITY): Payer: Self-pay | Admitting: Emergency Medicine

## 2014-03-24 ENCOUNTER — Emergency Department (HOSPITAL_COMMUNITY)
Admission: EM | Admit: 2014-03-24 | Discharge: 2014-03-24 | Disposition: A | Discharge status: Home / Self Care | Payer: Medicaid Other | Attending: Emergency Medicine | Admitting: Emergency Medicine

## 2014-03-24 DIAGNOSIS — F419 Anxiety disorder, unspecified: Secondary | ICD-10-CM | POA: Insufficient documentation

## 2014-03-24 DIAGNOSIS — R1310 Dysphagia, unspecified: Secondary | ICD-10-CM | POA: Diagnosis present

## 2014-03-24 DIAGNOSIS — Z79899 Other long term (current) drug therapy: Secondary | ICD-10-CM | POA: Diagnosis not present

## 2014-03-24 DIAGNOSIS — Z720 Tobacco use: Secondary | ICD-10-CM | POA: Diagnosis not present

## 2014-03-24 DIAGNOSIS — G8929 Other chronic pain: Secondary | ICD-10-CM | POA: Diagnosis not present

## 2014-03-24 DIAGNOSIS — F329 Major depressive disorder, single episode, unspecified: Secondary | ICD-10-CM | POA: Diagnosis not present

## 2014-03-24 DIAGNOSIS — Z8742 Personal history of other diseases of the female genital tract: Secondary | ICD-10-CM | POA: Insufficient documentation

## 2014-03-24 DIAGNOSIS — R070 Pain in throat: Secondary | ICD-10-CM | POA: Insufficient documentation

## 2014-03-24 DIAGNOSIS — Z7951 Long term (current) use of inhaled steroids: Secondary | ICD-10-CM | POA: Diagnosis not present

## 2014-03-24 DIAGNOSIS — Z87448 Personal history of other diseases of urinary system: Secondary | ICD-10-CM | POA: Insufficient documentation

## 2014-03-24 DIAGNOSIS — K219 Gastro-esophageal reflux disease without esophagitis: Secondary | ICD-10-CM | POA: Insufficient documentation

## 2014-03-24 MED ORDER — HYDROCODONE-ACETAMINOPHEN 5-325 MG PO TABS: 2.0000 | ORAL_TABLET | ORAL | Status: DC | PRN

## 2014-03-24 NOTE — Discharge Instructions (Signed)
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
## 2014-03-24 NOTE — ED Notes (Signed)
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 Pt in control of secretions, denies SOB at this time, states she feels herself "choking" in her her sleep.

## 2014-04-11 ENCOUNTER — Encounter: Payer: Self-pay | Admitting: *Deleted

## 2014-06-27 ENCOUNTER — Ambulatory Visit: Payer: Medicaid Other

## 2014-07-09 ENCOUNTER — Emergency Department (HOSPITAL_COMMUNITY): Payer: Medicaid Other

## 2014-07-09 ENCOUNTER — Emergency Department (HOSPITAL_COMMUNITY)
Admission: EM | Admit: 2014-07-09 | Discharge: 2014-07-09 | Disposition: A | Discharge status: Home / Self Care | Payer: Medicaid Other | Attending: Emergency Medicine | Admitting: Emergency Medicine

## 2014-07-09 ENCOUNTER — Encounter (HOSPITAL_COMMUNITY): Payer: Self-pay

## 2014-07-09 DIAGNOSIS — G8929 Other chronic pain: Secondary | ICD-10-CM | POA: Diagnosis not present

## 2014-07-09 DIAGNOSIS — Z7951 Long term (current) use of inhaled steroids: Secondary | ICD-10-CM | POA: Diagnosis not present

## 2014-07-09 DIAGNOSIS — F419 Anxiety disorder, unspecified: Secondary | ICD-10-CM | POA: Insufficient documentation

## 2014-07-09 DIAGNOSIS — R053 Chronic cough: Secondary | ICD-10-CM

## 2014-07-09 DIAGNOSIS — F329 Major depressive disorder, single episode, unspecified: Secondary | ICD-10-CM | POA: Diagnosis not present

## 2014-07-09 DIAGNOSIS — Z79899 Other long term (current) drug therapy: Secondary | ICD-10-CM | POA: Diagnosis not present

## 2014-07-09 DIAGNOSIS — R05 Cough: Secondary | ICD-10-CM | POA: Diagnosis present

## 2014-07-09 DIAGNOSIS — Z720 Tobacco use: Secondary | ICD-10-CM | POA: Insufficient documentation

## 2014-07-09 DIAGNOSIS — Z8742 Personal history of other diseases of the female genital tract: Secondary | ICD-10-CM | POA: Insufficient documentation

## 2014-07-09 DIAGNOSIS — K219 Gastro-esophageal reflux disease without esophagitis: Secondary | ICD-10-CM | POA: Insufficient documentation

## 2014-07-09 DIAGNOSIS — Z87448 Personal history of other diseases of urinary system: Secondary | ICD-10-CM | POA: Diagnosis not present

## 2014-07-09 MED ORDER — BENZONATATE 100 MG PO CAPS: 100.0000 mg | ORAL_CAPSULE | Freq: Three times a day (TID) | ORAL | Status: DC

## 2014-07-09 MED ORDER — BENZONATATE 100 MG PO CAPS
100.0000 mg | ORAL_CAPSULE | Freq: Once | ORAL | Status: AC
  Administered 2014-07-09: 100 mg via ORAL
  Filled 2014-07-09: qty 1
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
## 2014-07-09 NOTE — Discharge Instructions (Signed)
Please read and follow all provided instructions.  Your diagnoses today include:  1. Chronic cough     Tests performed today include:  Chest x-ray - no pneumonia or abnormal areas of lung  Vital signs. See below for your results today.   Medications prescribed:   Tessalon Perles - cough suppressant medication  Take any prescribed medications only as directed.  Home care instructions:  Follow any educational materials contained in this packet.  Follow-up instructions: Please follow-up with the lung doctor listed for further evaluation of your chronic cough.   Return instructions:   Please return to the Emergency Department if you experience worsening symptoms.   Please return if you have any other emergent concerns.  Additional Information:  Your vital signs today were: BP 127/89 mmHg   Pulse 110   Temp(Src) 98.1 F (36.7 C) (Oral)   Resp 16   SpO2 98%   LMP 06/14/2014 (Approximate) If your blood pressure (BP) was elevated above 135/85 this visit, please have this repeated by your doctor within one month. --------------

## 2014-07-09 NOTE — ED Notes (Signed)
Pt presents with c/o cough that she has had since December. Pt reports she had throat surgery in December and has had the cough since then.

## 2014-07-26 ENCOUNTER — Inpatient Hospital Stay: Payer: Medicaid Other | Admitting: Adult Health

## 2014-08-01 ENCOUNTER — Institutional Professional Consult (permissible substitution): Payer: Medicaid Other | Admitting: Internal Medicine

## 2015-03-05 ENCOUNTER — Encounter (HOSPITAL_COMMUNITY): Payer: Self-pay | Admitting: Emergency Medicine

## 2015-03-05 ENCOUNTER — Emergency Department (HOSPITAL_COMMUNITY)
Admission: EM | Admit: 2015-03-05 | Discharge: 2015-03-05 | Disposition: A | Discharge status: Home / Self Care | Payer: Medicaid Other | Attending: Emergency Medicine | Admitting: Emergency Medicine

## 2015-03-05 DIAGNOSIS — Z7951 Long term (current) use of inhaled steroids: Secondary | ICD-10-CM | POA: Insufficient documentation

## 2015-03-05 DIAGNOSIS — K219 Gastro-esophageal reflux disease without esophagitis: Secondary | ICD-10-CM | POA: Insufficient documentation

## 2015-03-05 DIAGNOSIS — F419 Anxiety disorder, unspecified: Secondary | ICD-10-CM | POA: Insufficient documentation

## 2015-03-05 DIAGNOSIS — H9203 Otalgia, bilateral: Secondary | ICD-10-CM | POA: Insufficient documentation

## 2015-03-05 DIAGNOSIS — F17210 Nicotine dependence, cigarettes, uncomplicated: Secondary | ICD-10-CM | POA: Insufficient documentation

## 2015-03-05 DIAGNOSIS — Z79899 Other long term (current) drug therapy: Secondary | ICD-10-CM | POA: Insufficient documentation

## 2015-03-05 DIAGNOSIS — F329 Major depressive disorder, single episode, unspecified: Secondary | ICD-10-CM | POA: Insufficient documentation

## 2015-03-05 DIAGNOSIS — R61 Generalized hyperhidrosis: Secondary | ICD-10-CM | POA: Insufficient documentation

## 2015-03-05 DIAGNOSIS — J069 Acute upper respiratory infection, unspecified: Secondary | ICD-10-CM

## 2015-03-05 DIAGNOSIS — Z8742 Personal history of other diseases of the female genital tract: Secondary | ICD-10-CM | POA: Insufficient documentation

## 2015-03-05 DIAGNOSIS — Z87448 Personal history of other diseases of urinary system: Secondary | ICD-10-CM | POA: Insufficient documentation

## 2015-03-05 DIAGNOSIS — G8929 Other chronic pain: Secondary | ICD-10-CM | POA: Insufficient documentation

## 2015-03-05 DIAGNOSIS — R319 Hematuria, unspecified: Secondary | ICD-10-CM | POA: Insufficient documentation

## 2015-03-05 DIAGNOSIS — Z87828 Personal history of other (healed) physical injury and trauma: Secondary | ICD-10-CM | POA: Insufficient documentation

## 2015-03-05 DIAGNOSIS — H578 Other specified disorders of eye and adnexa: Secondary | ICD-10-CM | POA: Insufficient documentation

## 2015-03-05 MED ORDER — AZITHROMYCIN 250 MG PO TABS: 250.0000 mg | ORAL_TABLET | Freq: Every day | ORAL | Status: DC

## 2015-03-05 MED ORDER — BENZONATATE 100 MG PO CAPS: 100.0000 mg | ORAL_CAPSULE | Freq: Three times a day (TID) | ORAL | Status: DC | PRN

## 2015-03-05 MED ORDER — PSEUDOEPHEDRINE HCL 60 MG PO TABS: 60.0000 mg | ORAL_TABLET | Freq: Four times a day (QID) | ORAL | Status: DC | PRN
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
## 2015-03-05 NOTE — ED Notes (Signed)
Pt reports cough, facial pressure, productive cough x 10 days. Denies fever, denies wheezing. Pt using OTC medications without relief

## 2015-03-05 NOTE — Discharge Instructions (Signed)
Read the information below.  Use the prescribed medication as directed.  Please discuss all new medications with your pharmacist.  You may return to the Emergency Department at any time for worsening condition or any new symptoms that concern you.    If you develop high fevers that do not resolve with tylenol or ibuprofen, you have difficulty swallowing or breathing, or you are unable to tolerate fluids by mouth, return to the ER for a recheck.      Upper Respiratory Infection, Adult Most upper respiratory infections (URIs) are caused by a virus. A URI affects the nose, throat, and upper air passages. The most common type of URI is often called "the common cold." HOME CARE   Take medicines only as told by your doctor.  Gargle warm saltwater or take cough drops to comfort your throat as told by your doctor.  Use a warm mist humidifier or inhale steam from a shower to increase air moisture. This may make it easier to breathe.  Drink enough fluid to keep your pee (urine) clear or pale yellow.  Eat soups and other clear broths.  Have a healthy diet.  Rest as needed.  Go back to work when your fever is gone or your doctor says it is okay.  You may need to stay home longer to avoid giving your URI to others.  You can also wear a face mask and wash your hands often to prevent spread of the virus.  Use your inhaler more if you have asthma.  Do not use any tobacco products, including cigarettes, chewing tobacco, or electronic cigarettes. If you need help quitting, ask your doctor. GET HELP IF:  You are getting worse, not better.  Your symptoms are not helped by medicine.  You have chills.  You are getting more short of breath.  You have brown or red mucus.  You have yellow or brown discharge from your nose.  You have pain in your face, especially when you bend forward.  You have a fever.  You have puffy (swollen) neck glands.  You have pain while swallowing.  You have white  areas in the back of your throat. GET HELP RIGHT AWAY IF:   You have very bad or constant:  Headache.  Ear pain.  Pain in your forehead, behind your eyes, and over your cheekbones (sinus pain).  Chest pain.  You have long-lasting (chronic) lung disease and any of the following:  Wheezing.  Long-lasting cough.  Coughing up blood.  A change in your usual mucus.  You have a stiff neck.  You have changes in your:  Vision.  Hearing.  Thinking.  Mood. MAKE SURE YOU:   Understand these instructions.  Will watch your condition.  Will get help right away if you are not doing well or get worse.   This information is not intended to replace advice given to you by your health care provider. Make sure you discuss any questions you have with your health care provider.   Document Released: 09/18/2007 Document Revised: 08/16/2014 Document Reviewed: 07/07/2013 Elsevier Interactive Patient Education [employer].

## 2015-05-21 ENCOUNTER — Emergency Department (HOSPITAL_COMMUNITY): Payer: Medicaid Other

## 2015-05-21 ENCOUNTER — Encounter (HOSPITAL_COMMUNITY): Payer: Self-pay | Admitting: Emergency Medicine

## 2015-05-21 ENCOUNTER — Emergency Department (HOSPITAL_COMMUNITY)
Admission: EM | Admit: 2015-05-21 | Discharge: 2015-05-21 | Disposition: A | Discharge status: Home / Self Care | Payer: Medicaid Other | Attending: Emergency Medicine | Admitting: Emergency Medicine

## 2015-05-21 DIAGNOSIS — Z79899 Other long term (current) drug therapy: Secondary | ICD-10-CM | POA: Insufficient documentation

## 2015-05-21 DIAGNOSIS — Z87828 Personal history of other (healed) physical injury and trauma: Secondary | ICD-10-CM | POA: Insufficient documentation

## 2015-05-21 DIAGNOSIS — Z7951 Long term (current) use of inhaled steroids: Secondary | ICD-10-CM | POA: Insufficient documentation

## 2015-05-21 DIAGNOSIS — R000 Tachycardia, unspecified: Secondary | ICD-10-CM | POA: Insufficient documentation

## 2015-05-21 DIAGNOSIS — Z86018 Personal history of other benign neoplasm: Secondary | ICD-10-CM | POA: Insufficient documentation

## 2015-05-21 DIAGNOSIS — F419 Anxiety disorder, unspecified: Secondary | ICD-10-CM | POA: Insufficient documentation

## 2015-05-21 DIAGNOSIS — G8929 Other chronic pain: Secondary | ICD-10-CM | POA: Insufficient documentation

## 2015-05-21 DIAGNOSIS — K219 Gastro-esophageal reflux disease without esophagitis: Secondary | ICD-10-CM | POA: Insufficient documentation

## 2015-05-21 DIAGNOSIS — F329 Major depressive disorder, single episode, unspecified: Secondary | ICD-10-CM | POA: Insufficient documentation

## 2015-05-21 DIAGNOSIS — Z8742 Personal history of other diseases of the female genital tract: Secondary | ICD-10-CM | POA: Insufficient documentation

## 2015-05-21 DIAGNOSIS — J069 Acute upper respiratory infection, unspecified: Secondary | ICD-10-CM

## 2015-05-21 DIAGNOSIS — F17210 Nicotine dependence, cigarettes, uncomplicated: Secondary | ICD-10-CM | POA: Insufficient documentation

## 2015-05-21 MED ORDER — NAPROXEN 375 MG PO TABS: 375.0000 mg | ORAL_TABLET | Freq: Two times a day (BID) | ORAL | Status: DC

## 2015-05-21 MED ORDER — BENZONATATE 100 MG PO CAPS: 200.0000 mg | ORAL_CAPSULE | Freq: Two times a day (BID) | ORAL | Status: DC | PRN

## 2015-05-21 MED ORDER — IBUPROFEN 200 MG PO TABS
600.0000 mg | ORAL_TABLET | Freq: Once | ORAL | Status: AC
  Administered 2015-05-21: 600 mg via ORAL
  Filled 2015-05-21: qty 3

## 2015-05-21 MED ORDER — OXYMETAZOLINE HCL 0.05 % NA SOLN: 1.0000 | Freq: Two times a day (BID) | NASAL | Status: DC
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
## 2015-05-21 NOTE — ED Notes (Signed)
Pt c/o nonproductive cough, chest pain with cough only. No chest pain when not cough.

## 2015-05-21 NOTE — Discharge Instructions (Signed)
Take your medications as prescribed. Continue drinking fluids to remain hydrated. May also drink warm water/tea with honey to help with her cough and sore throat. Follow-up with your primary care provider in 3-4 days. Return to the emergency department if symptoms worsen or new onset of fever, shortness of breath, chest pain, coughing up blood, wheezing, lightheadedness, syncope, vomiting.

## 2015-05-21 NOTE — ED Notes (Signed)
Primary assessment is document in error

## 2015-11-12 ENCOUNTER — Emergency Department (HOSPITAL_COMMUNITY)
Admission: EM | Admit: 2015-11-12 | Discharge: 2015-11-12 | Disposition: A | Discharge status: Home / Self Care | Payer: Medicaid Other | Attending: Emergency Medicine | Admitting: Emergency Medicine

## 2015-11-12 ENCOUNTER — Encounter (HOSPITAL_COMMUNITY): Payer: Self-pay | Admitting: Emergency Medicine

## 2015-11-12 DIAGNOSIS — F17210 Nicotine dependence, cigarettes, uncomplicated: Secondary | ICD-10-CM | POA: Insufficient documentation

## 2015-11-12 DIAGNOSIS — Z79899 Other long term (current) drug therapy: Secondary | ICD-10-CM | POA: Insufficient documentation

## 2015-11-12 DIAGNOSIS — M65331 Trigger finger, right middle finger: Secondary | ICD-10-CM | POA: Insufficient documentation

## 2015-11-12 DIAGNOSIS — F329 Major depressive disorder, single episode, unspecified: Secondary | ICD-10-CM | POA: Insufficient documentation

## 2015-11-12 MED ORDER — NAPROXEN 500 MG PO TABS: 500.0000 mg | ORAL_TABLET | Freq: Two times a day (BID) | ORAL | 0 refills | Status: DC

## 2015-11-12 NOTE — ED Triage Notes (Signed)
Patient states for the past 2 months she has been having right middle finger pain and trouble moving it without assistance.
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
## 2015-12-07 ENCOUNTER — Emergency Department (HOSPITAL_COMMUNITY)
Admission: EM | Admit: 2015-12-07 | Discharge: 2015-12-07 | Disposition: A | Discharge status: Home / Self Care | Payer: Medicaid Other | Attending: Emergency Medicine | Admitting: Emergency Medicine

## 2015-12-07 ENCOUNTER — Encounter (HOSPITAL_COMMUNITY): Payer: Self-pay | Admitting: Emergency Medicine

## 2015-12-07 DIAGNOSIS — F17210 Nicotine dependence, cigarettes, uncomplicated: Secondary | ICD-10-CM | POA: Insufficient documentation

## 2015-12-07 DIAGNOSIS — B9789 Other viral agents as the cause of diseases classified elsewhere: Secondary | ICD-10-CM

## 2015-12-07 DIAGNOSIS — J302 Other seasonal allergic rhinitis: Secondary | ICD-10-CM | POA: Insufficient documentation

## 2015-12-07 DIAGNOSIS — J069 Acute upper respiratory infection, unspecified: Secondary | ICD-10-CM | POA: Insufficient documentation

## 2015-12-07 DIAGNOSIS — Z79899 Other long term (current) drug therapy: Secondary | ICD-10-CM | POA: Insufficient documentation

## 2015-12-07 DIAGNOSIS — Z791 Long term (current) use of non-steroidal anti-inflammatories (NSAID): Secondary | ICD-10-CM | POA: Insufficient documentation

## 2015-12-07 DIAGNOSIS — J029 Acute pharyngitis, unspecified: Secondary | ICD-10-CM | POA: Insufficient documentation

## 2015-12-07 LAB — RAPID STREP SCREEN (MED CTR MEBANE ONLY): Streptococcus, Group A Screen (Direct): NEGATIVE

## 2015-12-07 MED ORDER — PHENOL 1.4 % MT LIQD
1.0000 | OROMUCOSAL | Status: DC | PRN
  Administered 2015-12-07: 1 via OROMUCOSAL
  Filled 2015-12-07: qty 177

## 2015-12-07 NOTE — Discharge Instructions (Signed)
Continue to stay well-hydrated. Gargle warm salt water and spit it out. Use chloraseptic spray as needed for sore throat. Continue to alternate between Tylenol and Ibuprofen for pain or fever. Use Mucinex for cough suppression/expectoration of mucus. Use netipot and flonase to help with nasal congestion. May consider over-the-counter Benadryl or other antihistamine to help your symptoms. Followup with your primary care doctor in 5-7 days for recheck of ongoing symptoms. Return to emergency department for emergent changing or worsening of symptoms.

## 2015-12-07 NOTE — ED Notes (Signed)
Pt ambulatory and independent at discharge.  Verbalized understanding of discharge instructions.  Pt stated she wished "to just go home" and refused discharge vital signs.

## 2015-12-07 NOTE — ED Triage Notes (Signed)
Patient c/o sore throat, runny nose, cough with yellow/green phlegm over the past couple days.  Patient states feels like a sinus infection.
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
## 2015-12-10 LAB — CULTURE, GROUP A STREP (THRC)

## 2016-09-03 ENCOUNTER — Emergency Department (HOSPITAL_COMMUNITY)
Admission: EM | Admit: 2016-09-03 | Discharge: 2016-09-03 | Disposition: A | Discharge status: Home / Self Care | Payer: Self-pay | Attending: Emergency Medicine | Admitting: Emergency Medicine

## 2016-09-03 ENCOUNTER — Encounter (HOSPITAL_COMMUNITY): Payer: Self-pay | Admitting: Emergency Medicine

## 2016-09-03 ENCOUNTER — Emergency Department (HOSPITAL_COMMUNITY): Payer: Self-pay

## 2016-09-03 DIAGNOSIS — S39012A Strain of muscle, fascia and tendon of lower back, initial encounter: Secondary | ICD-10-CM | POA: Insufficient documentation

## 2016-09-03 DIAGNOSIS — F17210 Nicotine dependence, cigarettes, uncomplicated: Secondary | ICD-10-CM | POA: Insufficient documentation

## 2016-09-03 DIAGNOSIS — X509XXA Other and unspecified overexertion or strenuous movements or postures, initial encounter: Secondary | ICD-10-CM | POA: Insufficient documentation

## 2016-09-03 DIAGNOSIS — Y999 Unspecified external cause status: Secondary | ICD-10-CM | POA: Insufficient documentation

## 2016-09-03 DIAGNOSIS — Y9389 Activity, other specified: Secondary | ICD-10-CM | POA: Insufficient documentation

## 2016-09-03 DIAGNOSIS — Y929 Unspecified place or not applicable: Secondary | ICD-10-CM | POA: Insufficient documentation

## 2016-09-03 LAB — URINALYSIS, ROUTINE W REFLEX MICROSCOPIC
BILIRUBIN URINE: NEGATIVE
Bacteria, UA: NONE SEEN
Glucose, UA: NEGATIVE mg/dL
Ketones, ur: NEGATIVE mg/dL
Leukocytes, UA: NEGATIVE
NITRITE: NEGATIVE
PH: 7 (ref 5.0–8.0)
Protein, ur: 30 mg/dL — AB
SPECIFIC GRAVITY, URINE: 1.025 (ref 1.005–1.030)
Squamous Epithelial / LPF: NONE SEEN

## 2016-09-03 MED ORDER — DEXAMETHASONE SODIUM PHOSPHATE 10 MG/ML IJ SOLN
10.0000 mg | Freq: Once | INTRAMUSCULAR | Status: AC
  Administered 2016-09-03: 10 mg via INTRAMUSCULAR
  Filled 2016-09-03: qty 1

## 2016-09-03 MED ORDER — DIAZEPAM 5 MG PO TABS
5.0000 mg | ORAL_TABLET | Freq: Once | ORAL | Status: AC
  Administered 2016-09-03: 5 mg via ORAL
  Filled 2016-09-03: qty 1

## 2016-09-03 MED ORDER — MORPHINE SULFATE (PF) 4 MG/ML IV SOLN
4.0000 mg | Freq: Once | INTRAVENOUS | Status: AC
  Administered 2016-09-03: 4 mg via INTRAMUSCULAR
  Filled 2016-09-03: qty 1

## 2016-09-03 MED ORDER — IBUPROFEN 600 MG PO TABS: 600.0000 mg | ORAL_TABLET | Freq: Four times a day (QID) | ORAL | 0 refills | Status: DC | PRN

## 2016-09-03 MED ORDER — DIAZEPAM 5 MG PO TABS: 5.0000 mg | ORAL_TABLET | Freq: Two times a day (BID) | ORAL | 0 refills | Status: DC

## 2016-09-03 MED ORDER — HYDROCODONE-ACETAMINOPHEN 5-325 MG PO TABS
1.0000 | ORAL_TABLET | ORAL | 0 refills | Status: DC | PRN
Start: 2016-09-03 — End: 2019-10-19

## 2016-09-03 MED ORDER — PREDNISONE 10 MG (21) PO TBPK
ORAL_TABLET | ORAL | 0 refills | Status: DC
Start: 2016-09-03 — End: 2017-08-24

## 2016-09-03 NOTE — ED Notes (Signed)
On way to CT 
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
## 2016-09-03 NOTE — ED Triage Notes (Signed)
Pt sts right sided back pain with radiation to right leg and some pain around flank worse with movement

## 2016-09-03 NOTE — ED Notes (Signed)
Pt given urine cup for urine specimen. 

## 2016-10-09 ENCOUNTER — Emergency Department (HOSPITAL_COMMUNITY)
Admission: EM | Admit: 2016-10-09 | Discharge: 2016-10-09 | Disposition: A | Discharge status: Home / Self Care | Payer: Medicaid Other | Attending: Emergency Medicine | Admitting: Emergency Medicine

## 2016-10-09 ENCOUNTER — Emergency Department (HOSPITAL_COMMUNITY): Payer: Medicaid Other

## 2016-10-09 ENCOUNTER — Encounter (HOSPITAL_COMMUNITY): Payer: Self-pay | Admitting: Emergency Medicine

## 2016-10-09 DIAGNOSIS — F17210 Nicotine dependence, cigarettes, uncomplicated: Secondary | ICD-10-CM | POA: Insufficient documentation

## 2016-10-09 DIAGNOSIS — R05 Cough: Secondary | ICD-10-CM

## 2016-10-09 DIAGNOSIS — J069 Acute upper respiratory infection, unspecified: Secondary | ICD-10-CM | POA: Insufficient documentation

## 2016-10-09 DIAGNOSIS — R059 Cough, unspecified: Secondary | ICD-10-CM

## 2016-10-09 DIAGNOSIS — R030 Elevated blood-pressure reading, without diagnosis of hypertension: Secondary | ICD-10-CM

## 2016-10-09 MED ORDER — AZITHROMYCIN 250 MG PO TABS: 250.0000 mg | ORAL_TABLET | Freq: Every day | ORAL | 0 refills | Status: DC

## 2016-10-09 NOTE — ED Triage Notes (Signed)
Pt comes in with complaints of sinus congestion over the past week.  States her nose is so irritated when she blows it there is traces of blood.  When asking patient if she has HTN she stated "I don't know, maybe". Endorses producing yellow mucus.

## 2016-10-09 NOTE — Discharge Instructions (Signed)
It was my pleasure taking care of you today!   Please take all of your antibiotics until finished! Flonase and mucinex for nasal congestion, tessalon as needed for cough. If you are not feeling better in 3 days, please call your primary care doctor or return to ER. Your blood pressure was elevated today. I would like you to call your regular doctor to schedule a blood pressure check in the next week or two.   Rest, drink plenty of fluids to be sure you are staying hydrated.   Return to the ER for high fevers, difficulty breathing or other concerning symptoms
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
## 2017-03-22 ENCOUNTER — Emergency Department (HOSPITAL_COMMUNITY): Payer: Self-pay

## 2017-03-22 ENCOUNTER — Encounter (HOSPITAL_COMMUNITY): Payer: Self-pay | Admitting: Emergency Medicine

## 2017-03-22 ENCOUNTER — Other Ambulatory Visit: Payer: Self-pay

## 2017-03-22 ENCOUNTER — Emergency Department (HOSPITAL_COMMUNITY)
Admission: EM | Admit: 2017-03-22 | Discharge: 2017-03-22 | Disposition: A | Discharge status: Home / Self Care | Payer: Self-pay | Attending: Emergency Medicine | Admitting: Emergency Medicine

## 2017-03-22 DIAGNOSIS — R058 Other specified cough: Secondary | ICD-10-CM

## 2017-03-22 DIAGNOSIS — R05 Cough: Secondary | ICD-10-CM | POA: Insufficient documentation

## 2017-03-22 DIAGNOSIS — Z79899 Other long term (current) drug therapy: Secondary | ICD-10-CM | POA: Insufficient documentation

## 2017-03-22 DIAGNOSIS — F17210 Nicotine dependence, cigarettes, uncomplicated: Secondary | ICD-10-CM | POA: Insufficient documentation

## 2017-03-22 DIAGNOSIS — N390 Urinary tract infection, site not specified: Secondary | ICD-10-CM | POA: Insufficient documentation

## 2017-03-22 DIAGNOSIS — Z720 Tobacco use: Secondary | ICD-10-CM

## 2017-03-22 LAB — URINALYSIS, ROUTINE W REFLEX MICROSCOPIC
BACTERIA UA: NONE SEEN
BILIRUBIN URINE: NEGATIVE
Glucose, UA: NEGATIVE mg/dL
KETONES UR: NEGATIVE mg/dL
NITRITE: NEGATIVE
PROTEIN: 30 mg/dL — AB
SQUAMOUS EPITHELIAL / LPF: NONE SEEN
Specific Gravity, Urine: 1.017 (ref 1.005–1.030)
pH: 7 (ref 5.0–8.0)

## 2017-03-22 LAB — BASIC METABOLIC PANEL
ANION GAP: 7 (ref 5–15)
BUN: 15 mg/dL (ref 6–20)
CALCIUM: 9.2 mg/dL (ref 8.9–10.3)
CO2: 23 mmol/L (ref 22–32)
CREATININE: 0.88 mg/dL (ref 0.44–1.00)
Chloride: 108 mmol/L (ref 101–111)
Glucose, Bld: 98 mg/dL (ref 65–99)
Potassium: 3.8 mmol/L (ref 3.5–5.1)
SODIUM: 138 mmol/L (ref 135–145)

## 2017-03-22 LAB — CBC WITH DIFFERENTIAL/PLATELET
BASOS ABS: 0.1 10*3/uL (ref 0.0–0.1)
BASOS PCT: 1 %
EOS ABS: 0.2 10*3/uL (ref 0.0–0.7)
Eosinophils Relative: 3 %
HCT: 38.3 % (ref 36.0–46.0)
HEMOGLOBIN: 12.4 g/dL (ref 12.0–15.0)
Lymphocytes Relative: 37 %
Lymphs Abs: 2.7 10*3/uL (ref 0.7–4.0)
MCH: 27 pg (ref 26.0–34.0)
MCHC: 32.4 g/dL (ref 30.0–36.0)
MCV: 83.4 fL (ref 78.0–100.0)
MONOS PCT: 6 %
Monocytes Absolute: 0.4 10*3/uL (ref 0.1–1.0)
NEUTROS PCT: 53 %
Neutro Abs: 3.9 10*3/uL (ref 1.7–7.7)
Platelets: 343 10*3/uL (ref 150–400)
RBC: 4.59 MIL/uL (ref 3.87–5.11)
RDW: 13.9 % (ref 11.5–15.5)
WBC: 7.4 10*3/uL (ref 4.0–10.5)

## 2017-03-22 LAB — POC URINE PREG, ED: PREG TEST UR: NEGATIVE

## 2017-03-22 MED ORDER — CEPHALEXIN 500 MG PO CAPS: ORAL_CAPSULE | ORAL | 0 refills | Status: DC

## 2017-03-22 NOTE — Discharge Instructions (Signed)
For your cough: Continue to stay well-hydrated. STOP SMOKING! Continue to alternate between Tylenol and Ibuprofen for pain or fever. Use Mucinex for cough suppression/expectoration of mucus. Use netipot and flonase to help with nasal congestion. May consider over-the-counter Benadryl or other antihistamine to decrease secretions and for help with your symptoms. Follow up with the [facility] in 5-7 days for recheck of ongoing symptoms and to establish medical care. Return to emergency department for emergent changing or worsening of symptoms.  For your urinary tract infection: Stay very well hydrated with plenty of water throughout the day. Take antibiotic until completed. May consider over-the-counter Pyridium for pain relief, but don't take this longer than 3 days, and be aware that it may turn your urine bright orange. This is a harmless side effect. Alternate between tylenol and motrin as needed for pain. Follow up with the wellness center in 1 week for recheck of ongoing symptoms and to establish medical care, but return to ER for emergent changing or worsening of symptoms. Please seek immediate care if you develop the following: You develop back pain.  Your symptoms are no better, or worse in 3 days. There is severe back pain or lower abdominal pain.  You develop chills.  You have a fever.  There is nausea or vomiting.  There is continued burning or discomfort with urination.
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
## 2017-03-22 NOTE — ED Triage Notes (Signed)
Pt states she is been having a persistent cough with greenish secretion for the past 3 weeks and she is not getting any better and she started having burning sensation and urgency with her urine. No fever or chills.

## 2017-03-25 LAB — URINE CULTURE: Culture: >=100000 — AB

## 2017-03-26 ENCOUNTER — Telehealth: Payer: Self-pay | Admitting: Emergency Medicine

## 2017-03-26 NOTE — Telephone Encounter (Signed)
Post ED Visit - Positive Culture Follow-up  Culture report reviewed by antimicrobial stewardship pharmacist:  []  Elenor Quinones, Pharm.D. []  Heide Guile, Pharm.D., BCPS AQ-ID [x]  Parks Neptune, Pharm.D., BCPS []  Alycia Rossetti, Pharm.D., BCPS []  [place], Pharm.D., BCPS, AAHIVP []  Legrand Como, Pharm.D., BCPS, AAHIVP []  Salome Arnt, PharmD, BCPS []  Dimitri Ped, PharmD, BCPS []  Vincenza Hews, PharmD, BCPS  Positive urine culture Treated with cephalexin, organism sensitive to the same and no further patient follow-up is required at this time.  Hazle Nordmann 03/26/2017, 3:44 PM

## 2017-03-27 ENCOUNTER — Telehealth: Payer: Self-pay | Admitting: *Deleted

## 2017-03-27 NOTE — Telephone Encounter (Signed)
Post ED Visit - Positive Culture Follow-up  Culture report reviewed by antimicrobial stewardship pharmacist:  []  Elenor Quinones, Pharm.D. []  Heide Guile, Pharm.D., BCPS AQ-ID []  Parks Neptune, Pharm.D., BCPS []  Alycia Rossetti, Pharm.D., BCPS []  [place], Pharm.D., BCPS, AAHIVP []  Legrand Como, Pharm.D., BCPS, AAHIVP []  Salome Arnt, PharmD, BCPS []  Dimitri Ped, PharmD, BCPS [x]  Vincenza Hews, PharmD, BCPS  Positive urine culture Treated with Cephalexin, organism sensitive to the same and no further patient follow-up is required at this time.  Harlon Flor [facility] 03/27/2017, 11:04 AM

## 2017-08-24 ENCOUNTER — Encounter (HOSPITAL_COMMUNITY): Payer: Self-pay | Admitting: Emergency Medicine

## 2017-08-24 ENCOUNTER — Emergency Department (HOSPITAL_COMMUNITY): Payer: Self-pay

## 2017-08-24 ENCOUNTER — Emergency Department (HOSPITAL_COMMUNITY)
Admission: EM | Admit: 2017-08-24 | Discharge: 2017-08-24 | Disposition: A | Discharge status: Home / Self Care | Payer: Self-pay | Attending: Physician Assistant | Admitting: Physician Assistant

## 2017-08-24 DIAGNOSIS — Z79899 Other long term (current) drug therapy: Secondary | ICD-10-CM | POA: Insufficient documentation

## 2017-08-24 DIAGNOSIS — F17210 Nicotine dependence, cigarettes, uncomplicated: Secondary | ICD-10-CM | POA: Insufficient documentation

## 2017-08-24 DIAGNOSIS — J40 Bronchitis, not specified as acute or chronic: Secondary | ICD-10-CM | POA: Insufficient documentation

## 2017-08-24 MED ORDER — BENZONATATE 200 MG PO CAPS: 200.0000 mg | ORAL_CAPSULE | Freq: Three times a day (TID) | ORAL | 0 refills | Status: DC | PRN

## 2017-08-24 MED ORDER — PREDNISONE 50 MG PO TABS: ORAL_TABLET | ORAL | 0 refills | Status: DC

## 2017-08-24 MED ORDER — ALBUTEROL SULFATE HFA 108 (90 BASE) MCG/ACT IN AERS
2.0000 | INHALATION_SPRAY | RESPIRATORY_TRACT | Status: DC | PRN
  Administered 2017-08-24: 2 via RESPIRATORY_TRACT
  Filled 2017-08-24: qty 6.7

## 2017-08-24 MED ORDER — ALBUTEROL SULFATE (2.5 MG/3ML) 0.083% IN NEBU
5.0000 mg | INHALATION_SOLUTION | Freq: Once | RESPIRATORY_TRACT | Status: AC
  Administered 2017-08-24: 5 mg via RESPIRATORY_TRACT
  Filled 2017-08-24: qty 6

## 2017-08-24 MED ORDER — RANITIDINE HCL 150 MG PO TABS: 150.0000 mg | ORAL_TABLET | Freq: Two times a day (BID) | ORAL | 0 refills | Status: DC

## 2017-08-24 MED ORDER — AZITHROMYCIN 250 MG PO TABS: 250.0000 mg | ORAL_TABLET | Freq: Every day | ORAL | 0 refills | Status: DC
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
## 2017-08-24 NOTE — ED Triage Notes (Signed)
Pt c/o chronic cough. Pt states she has tried OTC meds, no relief, pt continues to smoke.

## 2017-08-24 NOTE — Discharge Instructions (Signed)
Follow up with your doctor. Return here for worsening symptoms. Use the inhaler every 4 hours as needed.

## 2017-09-10 DIAGNOSIS — I10 Essential (primary) hypertension: Secondary | ICD-10-CM | POA: Insufficient documentation

## 2017-09-10 DIAGNOSIS — F17200 Nicotine dependence, unspecified, uncomplicated: Secondary | ICD-10-CM | POA: Insufficient documentation

## 2017-09-10 DIAGNOSIS — E663 Overweight: Secondary | ICD-10-CM | POA: Insufficient documentation

## 2017-09-10 DIAGNOSIS — G4700 Insomnia, unspecified: Secondary | ICD-10-CM | POA: Insufficient documentation

## 2017-11-17 DIAGNOSIS — E785 Hyperlipidemia, unspecified: Secondary | ICD-10-CM | POA: Insufficient documentation

## 2018-04-05 ENCOUNTER — Emergency Department (HOSPITAL_COMMUNITY)
Admission: EM | Admit: 2018-04-05 | Discharge: 2018-04-05 | Disposition: A | Discharge status: Home / Self Care | Payer: Self-pay | Attending: Emergency Medicine | Admitting: Emergency Medicine

## 2018-04-05 ENCOUNTER — Encounter (HOSPITAL_COMMUNITY): Payer: Self-pay | Admitting: Emergency Medicine

## 2018-04-05 ENCOUNTER — Other Ambulatory Visit: Payer: Self-pay

## 2018-04-05 DIAGNOSIS — N3001 Acute cystitis with hematuria: Secondary | ICD-10-CM | POA: Insufficient documentation

## 2018-04-05 DIAGNOSIS — Z79899 Other long term (current) drug therapy: Secondary | ICD-10-CM | POA: Insufficient documentation

## 2018-04-05 DIAGNOSIS — F17210 Nicotine dependence, cigarettes, uncomplicated: Secondary | ICD-10-CM | POA: Insufficient documentation

## 2018-04-05 LAB — URINALYSIS, MICROSCOPIC (REFLEX)

## 2018-04-05 LAB — URINALYSIS, ROUTINE W REFLEX MICROSCOPIC
BILIRUBIN URINE: NEGATIVE
Glucose, UA: NEGATIVE mg/dL
Ketones, ur: NEGATIVE mg/dL
NITRITE: NEGATIVE
Protein, ur: NEGATIVE mg/dL
Specific Gravity, Urine: 1.02 (ref 1.005–1.030)
pH: 5.5 (ref 5.0–8.0)

## 2018-04-05 MED ORDER — CEPHALEXIN 500 MG PO CAPS: 500.0000 mg | ORAL_CAPSULE | Freq: Three times a day (TID) | ORAL | 0 refills | Status: DC

## 2018-04-05 MED ORDER — PHENAZOPYRIDINE HCL 100 MG PO TABS
95.0000 mg | ORAL_TABLET | Freq: Once | ORAL | Status: AC
  Administered 2018-04-05: 100 mg via ORAL
  Filled 2018-04-05: qty 1

## 2018-04-05 MED ORDER — PHENAZOPYRIDINE HCL 100 MG PO TABS: 100.0000 mg | ORAL_TABLET | Freq: Three times a day (TID) | ORAL | 0 refills | Status: DC | PRN

## 2018-04-05 MED ORDER — CEPHALEXIN 250 MG PO CAPS
500.0000 mg | ORAL_CAPSULE | Freq: Once | ORAL | Status: AC
Start: 2018-04-05 — End: 2018-04-05
  Administered 2018-04-05: 500 mg via ORAL
  Filled 2018-04-05: qty 2

## 2018-04-05 NOTE — ED Triage Notes (Signed)
Pt c/o pain with urination and blood in urine x 1 day.
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
## 2018-04-07 LAB — URINE CULTURE: Culture: 20000 — AB

## 2018-04-08 ENCOUNTER — Telehealth: Payer: Self-pay | Admitting: Emergency Medicine

## 2018-04-08 NOTE — Telephone Encounter (Signed)
Post ED Visit - Positive Culture Follow-up  Culture report reviewed by antimicrobial stewardship pharmacist:  []  Elenor Quinones, Pharm.D. [x]  Heide Guile, Pharm.D., BCPS AQ-ID []  Parks Neptune, Pharm.D., BCPS []  Alycia Rossetti, Pharm.D., BCPS []  [place], Pharm.D., BCPS, AAHIVP []  Legrand Como, Pharm.D., BCPS, AAHIVP []  Salome Arnt, PharmD, BCPS []  Johnnette Gourd, PharmD, BCPS []  Hughes Better, PharmD, BCPS []  Leeroy Cha, PharmD  Positive urine culture Treated with cephalexin, organism sensitive to the same and no further patient follow-up is required at this time.  Hazle Nordmann 04/08/2018, 11:53 AM

## 2018-08-19 ENCOUNTER — Emergency Department (HOSPITAL_COMMUNITY)
Admission: EM | Admit: 2018-08-19 | Discharge: 2018-08-19 | Disposition: A | Discharge status: Home / Self Care | Payer: Self-pay | Attending: Emergency Medicine | Admitting: Emergency Medicine

## 2018-08-19 ENCOUNTER — Encounter (HOSPITAL_COMMUNITY): Payer: Self-pay | Admitting: Emergency Medicine

## 2018-08-19 ENCOUNTER — Other Ambulatory Visit: Payer: Self-pay

## 2018-08-19 DIAGNOSIS — Z79899 Other long term (current) drug therapy: Secondary | ICD-10-CM | POA: Insufficient documentation

## 2018-08-19 DIAGNOSIS — B309 Viral conjunctivitis, unspecified: Secondary | ICD-10-CM | POA: Insufficient documentation

## 2018-08-19 DIAGNOSIS — R0981 Nasal congestion: Secondary | ICD-10-CM | POA: Insufficient documentation

## 2018-08-19 DIAGNOSIS — F17210 Nicotine dependence, cigarettes, uncomplicated: Secondary | ICD-10-CM | POA: Insufficient documentation

## 2018-08-19 MED ORDER — FLUORESCEIN SODIUM 1 MG OP STRP
1.0000 | ORAL_STRIP | Freq: Once | OPHTHALMIC | Status: DC
  Filled 2018-08-19: qty 1

## 2018-08-19 MED ORDER — TETRACAINE HCL 0.5 % OP SOLN
2.0000 [drp] | Freq: Once | OPHTHALMIC | Status: DC
  Filled 2018-08-19: qty 4

## 2018-08-19 MED ORDER — ERYTHROMYCIN 5 MG/GM OP OINT: TOPICAL_OINTMENT | OPHTHALMIC | 0 refills | Status: DC

## 2018-08-19 NOTE — Discharge Instructions (Addendum)
You have been diagnosed today with Viral Conjunctivitis of the left eye.  At this time there does not appear to be the presence of an emergent medical condition, however there is always the potential for conditions to change. Please read and follow the below instructions.  Please return to the Emergency Department immediately for any new or worsening symptoms or if your symptoms do not improve within 2 days. Please be sure to follow up with your Primary Care Provider within one week regarding your visit today; please call their office to schedule an appointment even if you are feeling better for a follow-up visit. Please use the antibiotic ointment erythromycin as prescribed in your left eye 4 times daily.  Please call the eye doctor Dr. Posey Pronto tomorrow to schedule a follow-up appointment regarding your left eye redness and pain.  Return to the emergency department immediately for any new or worsening symptoms including swelling, fever, vision changes or pain with light.  Get help right away if: Your symptoms do not improve with treatment or they get worse. You have increased pain. Your vision becomes blurry. You have a fever. You have facial pain, redness, or swelling. You have yellow or green drainage coming from your eye. You have new symptoms.  Please read the additional information packets attached to your discharge summary.  Do not take your medicine if  develop an itchy rash, swelling in your mouth or lips, or difficulty breathing.

## 2018-08-19 NOTE — ED Triage Notes (Signed)
Pt in with c/o L eye redness/irritation x 2 days. C/o eye watering, denies any itching or pain. Denies any vision changes, just feels her "equilibrium" is off.
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
## 2019-01-27 DIAGNOSIS — F319 Bipolar disorder, unspecified: Secondary | ICD-10-CM | POA: Insufficient documentation

## 2019-01-27 DIAGNOSIS — F17200 Nicotine dependence, unspecified, uncomplicated: Secondary | ICD-10-CM | POA: Insufficient documentation

## 2019-01-27 DIAGNOSIS — F1020 Alcohol dependence, uncomplicated: Secondary | ICD-10-CM | POA: Insufficient documentation

## 2019-01-27 DIAGNOSIS — F1110 Opioid abuse, uncomplicated: Secondary | ICD-10-CM | POA: Insufficient documentation

## 2019-01-27 DIAGNOSIS — F3181 Bipolar II disorder: Secondary | ICD-10-CM | POA: Insufficient documentation

## 2019-10-19 ENCOUNTER — Ambulatory Visit: Payer: Self-pay | Admitting: Physician Assistant

## 2019-10-19 ENCOUNTER — Other Ambulatory Visit: Payer: Self-pay

## 2019-10-19 VITALS — BP 121/86 | HR 84 | Temp 98.7°F | Resp 18 | Ht 64.0 in | Wt 164.0 lb

## 2019-10-19 DIAGNOSIS — F17200 Nicotine dependence, unspecified, uncomplicated: Secondary | ICD-10-CM

## 2019-10-19 DIAGNOSIS — R05 Cough: Secondary | ICD-10-CM

## 2019-10-19 DIAGNOSIS — M25552 Pain in left hip: Secondary | ICD-10-CM

## 2019-10-19 DIAGNOSIS — M25551 Pain in right hip: Secondary | ICD-10-CM

## 2019-10-19 DIAGNOSIS — R0602 Shortness of breath: Secondary | ICD-10-CM

## 2019-10-19 DIAGNOSIS — R053 Chronic cough: Secondary | ICD-10-CM

## 2019-10-19 MED ORDER — BENZONATATE 100 MG PO CAPS: 100.0000 mg | ORAL_CAPSULE | Freq: Two times a day (BID) | ORAL | 0 refills | Status: DC | PRN

## 2019-10-19 MED ORDER — ALBUTEROL SULFATE HFA 108 (90 BASE) MCG/ACT IN AERS
2.0000 | INHALATION_SPRAY | Freq: Once | RESPIRATORY_TRACT | Status: AC
Start: 2019-10-19 — End: 2019-10-19
  Administered 2019-10-19: 2 via RESPIRATORY_TRACT

## 2019-10-19 MED ORDER — KETOROLAC TROMETHAMINE 60 MG/2ML IM SOLN
60.0000 mg | Freq: Once | INTRAMUSCULAR | Status: AC
  Administered 2019-10-19: 60 mg via INTRAMUSCULAR

## 2019-10-19 NOTE — Progress Notes (Signed)
Patient complains of loss smell intermittently and cough has been present. Patient complains of living in dry space with a humidifier.

## 2019-10-19 NOTE — Patient Instructions (Signed)
Make sure to follow up with your PCP regarding your hip pain and general health needs.  Please feel free to return to the mobile unit if there is anything else we can do for you   Beth Rad, PA-C Physician Assistant [facility] [email or web address]    Hip Pain The hip is the joint between the upper legs and the lower pelvis. The bones, cartilage, tendons, and muscles of your hip joint support your body and allow you to move around. Hip pain can range from a minor ache to severe pain in one or both of your hips. The pain may be felt on the inside of the hip joint near the groin, or on the outside near the buttocks and upper thigh. You may also have swelling or stiffness in your hip area. Follow these instructions at home: Managing pain, stiffness, and swelling      If directed, put ice on the painful area. To do this: ? Put ice in a plastic bag. ? Place a towel between your skin and the bag. ? Leave the ice on for 20 minutes, 2-3 times a day.  If directed, apply heat to the affected area as often as told by your health care provider. Use the heat source that your health care provider recommends, such as a moist heat pack or a heating pad. ? Place a towel between your skin and the heat source. ? Leave the heat on for 20-30 minutes. ? Remove the heat if your skin turns bright red. This is especially important if you are unable to feel pain, heat, or cold. You may have a greater risk of getting burned. Activity  Do exercises as told by your health care provider.  Avoid activities that cause pain. General instructions   Take over-the-counter and prescription medicines only as told by your health care provider.  Keep a journal of your symptoms. Write down: ? How often you have hip pain. ? The location of your pain. ? What the pain feels like. ? What makes the pain worse.  Sleep with a pillow between your legs on your  most comfortable side.  Keep all follow-up visits as told by your health care provider. This is important. Contact a health care provider if:  You cannot put weight on your leg.  Your pain or swelling continues or gets worse after one week.  It gets harder to walk.  You have a fever. Get help right away if:  You fall.  You have a sudden increase in pain and swelling in your hip.  Your hip is red or swollen or very tender to touch. Summary  Hip pain can range from a minor ache to severe pain in one or both of your hips.  The pain may be felt on the inside of the hip joint near the groin, or on the outside near the buttocks and upper thigh.  Avoid activities that cause pain.  Write down how often you have hip pain, the location of the pain, what makes it worse, and what it feels like. This information is not intended to replace advice given to you by your health care provider. Make sure you discuss any questions you have with your health care provider. Document Revised: 08/17/2018 Document Reviewed: 08/17/2018 Elsevier Patient Education  [place].   Cough, Adult Coughing is a reflex that clears your throat and your airways (respiratory system). Coughing helps to heal and protect your lungs. It is normal to cough occasionally,  but a cough that happens with other symptoms or lasts a long time may be a sign of a condition that needs treatment. An acute cough may only last 2-3 weeks, while a chronic cough may last 8 or more weeks. Coughing is commonly caused by:  Infection of the respiratory systemby viruses or bacteria.  Breathing in substances that irritate your lungs.  Allergies.  Asthma.  Mucus that runs down the back of your throat (postnasal drip).  Smoking.  Acid backing up from the stomach into the esophagus (gastroesophageal reflux).  Certain medicines.  Chronic lung problems.  Other medical conditions such as heart failure or a blood clot in the  lung (pulmonary embolism). Follow these instructions at home: Medicines  Take over-the-counter and prescription medicines only as told by your health care provider.  Talk with your health care provider before you take a cough suppressant medicine. Lifestyle   Avoid cigarette smoke. Do not use any products that contain nicotine or tobacco, such as cigarettes, e-cigarettes, and chewing tobacco. If you need help quitting, ask your health care provider.  Drink enough fluid to keep your urine pale yellow.  Avoid caffeine.  Do not drink alcohol if your health care provider tells you not to drink. General instructions   Pay close attention to changes in your cough. Tell your health care provider about them.  Always cover your mouth when you cough.  Avoid things that make you cough, such as perfume, candles, cleaning products, or campfire or tobacco smoke.  If the air is dry, use a cool mist vaporizer or humidifier in your bedroom or your home to help loosen secretions.  If your cough is worse at night, try to sleep in a semi-upright position.  Rest as needed.  Keep all follow-up visits as told by your health care provider. This is important. Contact a health care provider if you:  Have new symptoms.  Cough up pus.  Have a cough that does not get better after 2-3 weeks or gets worse.  Cannot control your cough with cough suppressant medicines and you are losing sleep.  Have pain that gets worse or pain that is not helped with medicine.  Have a fever.  Have unexplained weight loss.  Have night sweats. Get help right away if:  You cough up blood.  You have difficulty breathing.  Your heartbeat is very fast. These symptoms may represent a serious problem that is an emergency. Do not wait to see if the symptoms will go away. Get medical help right away. Call your local emergency services (911 in the [place] Do not drive yourself to the hospital. Summary  Coughing is a  reflex that clears your throat and your airways. It is normal to cough occasionally, but a cough that happens with other symptoms or lasts a long time may be a sign of a condition that needs treatment.  Take over-the-counter and prescription medicines only as told by your health care provider.  Always cover your mouth when you cough.  Contact a health care provider if you have new symptoms or a cough that does not get better after 2-3 weeks or gets worse. This information is not intended to replace advice given to you by your health care provider. Make sure you discuss any questions you have with your health care provider. Document Revised: 04/20/2018 Document Reviewed: 04/20/2018 Elsevier Patient Education  [place].
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
## 2020-03-06 ENCOUNTER — Other Ambulatory Visit: Payer: Self-pay

## 2020-03-06 ENCOUNTER — Encounter (HOSPITAL_COMMUNITY): Payer: Self-pay | Admitting: Emergency Medicine

## 2020-03-06 ENCOUNTER — Emergency Department (HOSPITAL_COMMUNITY)
Admission: EM | Admit: 2020-03-06 | Discharge: 2020-03-06 | Disposition: A | Discharge status: Home / Self Care | Payer: Medicaid Other | Attending: Emergency Medicine | Admitting: Emergency Medicine

## 2020-03-06 ENCOUNTER — Ambulatory Visit (HOSPITAL_COMMUNITY)
Admission: EM | Admit: 2020-03-06 | Discharge: 2020-03-06 | Disposition: A | Discharge status: Home / Self Care | Payer: Medicaid Other | Attending: Family Medicine | Admitting: Family Medicine

## 2020-03-06 DIAGNOSIS — W01198A Fall on same level from slipping, tripping and stumbling with subsequent striking against other object, initial encounter: Secondary | ICD-10-CM | POA: Insufficient documentation

## 2020-03-06 DIAGNOSIS — Z5321 Procedure and treatment not carried out due to patient leaving prior to being seen by health care provider: Secondary | ICD-10-CM | POA: Diagnosis not present

## 2020-03-06 DIAGNOSIS — S0990XA Unspecified injury of head, initial encounter: Secondary | ICD-10-CM

## 2020-03-06 DIAGNOSIS — R202 Paresthesia of skin: Secondary | ICD-10-CM | POA: Insufficient documentation

## 2020-03-06 LAB — BASIC METABOLIC PANEL
Anion gap: 12 (ref 5–15)
BUN: 13 mg/dL (ref 6–20)
CO2: 23 mmol/L (ref 22–32)
Calcium: 9.6 mg/dL (ref 8.9–10.3)
Chloride: 107 mmol/L (ref 98–111)
Creatinine, Ser: 0.83 mg/dL (ref 0.44–1.00)
GFR, Estimated: >60 mL/min (ref >60)
Glucose, Bld: 111 mg/dL — ABNORMAL HIGH (ref 70–99)
Potassium: 4 mmol/L (ref 3.5–5.1)
Sodium: 142 mmol/L (ref 135–145)

## 2020-03-06 LAB — CBC
HCT: 41.4 % (ref 36.0–46.0)
Hemoglobin: 12.8 g/dL (ref 12.0–15.0)
MCH: 26.9 pg (ref 26.0–34.0)
MCHC: 30.9 g/dL (ref 30.0–36.0)
MCV: 87 fL (ref 80.0–100.0)
Platelets: 347 10*3/uL (ref 150–400)
RBC: 4.76 MIL/uL (ref 3.87–5.11)
RDW: 13.4 % (ref 11.5–15.5)
WBC: 8.3 10*3/uL (ref 4.0–10.5)
nRBC: 0 % (ref 0.0–0.2)

## 2020-03-06 LAB — URINALYSIS, ROUTINE W REFLEX MICROSCOPIC
Bilirubin Urine: NEGATIVE
Glucose, UA: NEGATIVE mg/dL
Ketones, ur: NEGATIVE mg/dL
Leukocytes,Ua: NEGATIVE
Nitrite: NEGATIVE
Protein, ur: NEGATIVE mg/dL
Specific Gravity, Urine: 1.033 — ABNORMAL HIGH (ref 1.005–1.030)
pH: 5 (ref 5.0–8.0)

## 2020-03-06 LAB — I-STAT BETA HCG BLOOD, ED (MC, WL, AP ONLY): I-stat hCG, quantitative: <5 m[IU]/mL (ref <5)

## 2020-03-06 NOTE — ED Provider Notes (Signed)
BP (!) 130/102 (BP Location: Right Arm)    Pulse 91    Temp 97.8 F (36.6 C) (Oral)    Resp 20    LMP 06/14/2014 (Approximate)    SpO2 97%  Spoke to patient She is vague about her injury Says she fell on stairs and hit her head, bruised both ears.  Might have had a loss of consciousness.  Now has numbness on her left side of her body and "maybe" weakness.  Is directed to the ER.  Albin Felling, MD 03/06/20 1447

## 2020-03-06 NOTE — ED Triage Notes (Addendum)
3 falls in the last 4 months.  Patient was running up steps on Thursday 11/18/  Patient was running up steps and stepped on card board on floor, slipped and fell, striking head.    No loc.  Now  Reports left side of body is numb and left side of head is numb.   Patient reports left thumb is painful.  Cant hold or pick up anything with left hand, left thumb is painful.

## 2020-03-06 NOTE — ED Notes (Signed)
lwbs

## 2020-03-06 NOTE — ED Triage Notes (Addendum)
Pt arrives from urgent care for c/o of a fall on Thursday after slipping on a box. Does endorse hitting the R side of her head, denies LOC. No blood thinners. No facial numbness. States that numbness began sometime last night, pt unable to pinpoint what time. Pt is poor historian.  A/ox4, speech clear, face symmetrical. Sent here for further eval.

## 2020-03-06 NOTE — ED Notes (Signed)
Patient is being discharged from the Urgent Care and sent to the Emergency Department via private vehicle . Per dr Meda Coffee, patient is in need of higher level of care due to striking head and numbness. Patient is aware and verbalizes understanding of plan of care.  Vitals:   03/06/20 1431  BP: (!) 130/102  Pulse: 91  Resp: 20  Temp: 97.8 F (36.6 C)  SpO2: 97%

## 2020-03-06 NOTE — Discharge Instructions (Addendum)
Go to ER

## 2020-03-06 NOTE — ED Notes (Signed)
Lwbs. 

## 2020-04-16 ENCOUNTER — Other Ambulatory Visit: Payer: Self-pay

## 2020-04-16 ENCOUNTER — Emergency Department (HOSPITAL_COMMUNITY)
Admission: EM | Admit: 2020-04-16 | Discharge: 2020-04-16 | Disposition: A | Discharge status: Home / Self Care | Payer: Medicaid Other | Attending: Emergency Medicine | Admitting: Emergency Medicine

## 2020-04-16 DIAGNOSIS — R232 Flushing: Secondary | ICD-10-CM | POA: Diagnosis not present

## 2020-04-16 DIAGNOSIS — R531 Weakness: Secondary | ICD-10-CM | POA: Diagnosis present

## 2020-04-16 DIAGNOSIS — Z20822 Contact with and (suspected) exposure to covid-19: Secondary | ICD-10-CM | POA: Diagnosis not present

## 2020-04-16 DIAGNOSIS — Z5321 Procedure and treatment not carried out due to patient leaving prior to being seen by health care provider: Secondary | ICD-10-CM | POA: Diagnosis not present

## 2020-04-16 LAB — RESP PANEL BY RT-PCR (FLU A&B, COVID) ARPGX2
Influenza A by PCR: NEGATIVE
Influenza B by PCR: NEGATIVE
SARS Coronavirus 2 by RT PCR: NEGATIVE

## 2020-04-16 NOTE — ED Triage Notes (Signed)
Pt said she has been feeling generalized weakness and feeling, hot then cold.

## 2020-04-16 NOTE — ED Notes (Signed)
Patient states someone told her it would be nine hours and she can't wait that long

## 2020-08-14 ENCOUNTER — Encounter: Payer: Self-pay | Admitting: Endocrinology

## 2020-09-22 ENCOUNTER — Other Ambulatory Visit: Payer: Self-pay | Admitting: Nurse Practitioner

## 2020-09-22 DIAGNOSIS — Z1231 Encounter for screening mammogram for malignant neoplasm of breast: Secondary | ICD-10-CM

## 2020-11-15 ENCOUNTER — Other Ambulatory Visit: Payer: Self-pay

## 2020-11-15 ENCOUNTER — Ambulatory Visit (HOSPITAL_COMMUNITY)
Admission: EM | Admit: 2020-11-15 | Discharge: 2020-11-15 | Disposition: A | Discharge status: Home / Self Care | Payer: 59

## 2020-11-15 NOTE — ED Notes (Signed)
Pt not answered.

## 2020-11-15 NOTE — ED Notes (Signed)
Pt access checked in the lobby, bathrooms and parking lot, pt did not answered.

## 2020-11-22 DIAGNOSIS — R7303 Prediabetes: Secondary | ICD-10-CM | POA: Insufficient documentation

## 2020-11-22 DIAGNOSIS — E559 Vitamin D deficiency, unspecified: Secondary | ICD-10-CM | POA: Insufficient documentation

## 2021-01-25 DIAGNOSIS — M6530 Trigger finger, unspecified finger: Secondary | ICD-10-CM | POA: Insufficient documentation

## 2021-05-17 ENCOUNTER — Ambulatory Visit: Payer: Medicaid Other | Admitting: Endocrinology

## 2021-07-24 ENCOUNTER — Other Ambulatory Visit: Payer: Self-pay | Admitting: Nurse Practitioner

## 2021-07-24 DIAGNOSIS — Z1231 Encounter for screening mammogram for malignant neoplasm of breast: Secondary | ICD-10-CM

## 2021-08-27 ENCOUNTER — Other Ambulatory Visit: Payer: Self-pay | Admitting: Family Medicine

## 2021-08-27 DIAGNOSIS — R1032 Left lower quadrant pain: Secondary | ICD-10-CM

## 2021-08-30 ENCOUNTER — Ambulatory Visit
Admission: RE | Admit: 2021-08-30 | Discharge: 2021-08-30 | Disposition: A | Discharge status: Home / Self Care | Payer: Medicaid Other | Source: Ambulatory Visit | Attending: Family Medicine | Admitting: Family Medicine

## 2021-08-30 DIAGNOSIS — R1032 Left lower quadrant pain: Secondary | ICD-10-CM

## 2022-04-26 ENCOUNTER — Emergency Department (HOSPITAL_COMMUNITY): Payer: Medicaid Other

## 2022-04-26 ENCOUNTER — Other Ambulatory Visit: Payer: Self-pay

## 2022-04-26 ENCOUNTER — Encounter (HOSPITAL_COMMUNITY): Payer: Self-pay

## 2022-04-26 ENCOUNTER — Emergency Department (HOSPITAL_COMMUNITY)
Admission: EM | Admit: 2022-04-26 | Discharge: 2022-04-26 | Disposition: A | Discharge status: Home / Self Care | Payer: Medicaid Other | Attending: Emergency Medicine | Admitting: Emergency Medicine

## 2022-04-26 DIAGNOSIS — N950 Postmenopausal bleeding: Secondary | ICD-10-CM | POA: Insufficient documentation

## 2022-04-26 DIAGNOSIS — Z79899 Other long term (current) drug therapy: Secondary | ICD-10-CM | POA: Diagnosis not present

## 2022-04-26 DIAGNOSIS — R9389 Abnormal findings on diagnostic imaging of other specified body structures: Secondary | ICD-10-CM | POA: Diagnosis not present

## 2022-04-26 DIAGNOSIS — I10 Essential (primary) hypertension: Secondary | ICD-10-CM | POA: Diagnosis not present

## 2022-04-26 DIAGNOSIS — D219 Benign neoplasm of connective and other soft tissue, unspecified: Secondary | ICD-10-CM

## 2022-04-26 DIAGNOSIS — N939 Abnormal uterine and vaginal bleeding, unspecified: Secondary | ICD-10-CM | POA: Diagnosis present

## 2022-04-26 DIAGNOSIS — D259 Leiomyoma of uterus, unspecified: Secondary | ICD-10-CM | POA: Diagnosis not present

## 2022-04-26 LAB — CBC
HCT: 40.4 % (ref 36.0–46.0)
Hemoglobin: 12.7 g/dL (ref 12.0–15.0)
MCH: 26.7 pg (ref 26.0–34.0)
MCHC: 31.4 g/dL (ref 30.0–36.0)
MCV: 85.1 fL (ref 80.0–100.0)
Platelets: 365 10*3/uL (ref 150–400)
RBC: 4.75 MIL/uL (ref 3.87–5.11)
RDW: 13.6 % (ref 11.5–15.5)
WBC: 7.5 10*3/uL (ref 4.0–10.5)
nRBC: 0 % (ref 0.0–0.2)

## 2022-04-26 LAB — URINALYSIS, ROUTINE W REFLEX MICROSCOPIC
Bilirubin Urine: NEGATIVE
Glucose, UA: NEGATIVE mg/dL
Ketones, ur: NEGATIVE mg/dL
Leukocytes,Ua: NEGATIVE
Nitrite: NEGATIVE
Protein, ur: 100 mg/dL — AB
RBC / HPF: >50 RBC/hpf — ABNORMAL HIGH (ref 0–5)
Specific Gravity, Urine: 1.029 (ref 1.005–1.030)
pH: 5 (ref 5.0–8.0)

## 2022-04-26 LAB — COMPREHENSIVE METABOLIC PANEL
ALT: 15 U/L (ref 0–44)
AST: 17 U/L (ref 15–41)
Albumin: 3.6 g/dL (ref 3.5–5.0)
Alkaline Phosphatase: 69 U/L (ref 38–126)
Anion gap: 8 (ref 5–15)
BUN: 13 mg/dL (ref 6–20)
CO2: 23 mmol/L (ref 22–32)
Calcium: 9.5 mg/dL (ref 8.9–10.3)
Chloride: 109 mmol/L (ref 98–111)
Creatinine, Ser: 0.66 mg/dL (ref 0.44–1.00)
GFR, Estimated: >60 mL/min (ref >60)
Glucose, Bld: 118 mg/dL — ABNORMAL HIGH (ref 70–99)
Potassium: 3.5 mmol/L (ref 3.5–5.1)
Sodium: 140 mmol/L (ref 135–145)
Total Bilirubin: 0.1 mg/dL — ABNORMAL LOW (ref 0.3–1.2)
Total Protein: 6.9 g/dL (ref 6.5–8.1)

## 2022-04-26 LAB — TYPE AND SCREEN
ABO/RH(D): O POS
Antibody Screen: NEGATIVE

## 2022-04-26 LAB — LIPASE, BLOOD: Lipase: 99 U/L — ABNORMAL HIGH (ref 11–51)

## 2022-04-26 MED ORDER — ACETAMINOPHEN 325 MG PO TABS
650.0000 mg | ORAL_TABLET | Freq: Once | ORAL | Status: AC
  Administered 2022-04-26: 650 mg via ORAL
  Filled 2022-04-26: qty 2

## 2022-04-26 MED ORDER — IBUPROFEN 400 MG PO TABS
600.0000 mg | ORAL_TABLET | Freq: Once | ORAL | Status: AC
  Administered 2022-04-26: 600 mg via ORAL
  Filled 2022-04-26: qty 1

## 2022-04-26 NOTE — ED Triage Notes (Signed)
Pt came in POV d/t vaginal bleeding that started today. She states that she has not had a period since 2008 but she randomly started bleeding a large amount today, denies dizziness/lightheadedness.

## 2022-04-26 NOTE — Discharge Instructions (Addendum)
You were seen in the emergency department for your postmenopausal bleeding.  Your ultrasound showed that you have a fibroid which is a benign growth within your uterus that can cause bleeding in your endometrium which is the lining of your uterus also appeared thickened and could be contributing to the bleeding.  You should follow-up with OB/GYN to have your symptoms rechecked and you will likely require a biopsy of your endometrium to make sure that it is not cancerous cause of your bleeding.  You can take Tylenol and Motrin as needed for pain and both can be taken up to every 6 hours.  You should return to the emergency department if you are having heavy vaginal bleeding where you are bleeding through more than a pad an hour, you are becoming lightheaded or you pass out or if you have any other new or concerning symptoms.

## 2022-04-26 NOTE — ED Provider Notes (Signed)
[facility] EMERGENCY DEPARTMENT Provider Note   CSN: [id number] Arrival date & time: 04/26/22  1710     History  Chief Complaint  Patient presents with   Vaginal Bleeding    Beth Cole is a 62 y.o. female.  Patient is a 62 year old female with a past medical history of hypertension, GERD, and anxiety presenting to the emergency department with vaginal bleeding.  The patient states that she woke up this morning with vaginal bleeding and states that she has not had a period since 2008.  She states that she has gone through about 4 pads today.  She denies any lightheadedness or dizziness but does report lower abdominal cramping.  She denies any blood thinner use.  The history is provided by the patient.       Home Medications Prior to Admission medications   Medication Sig Start Date End Date Taking? Authorizing Provider  atorvastatin (LIPITOR) 20 MG tablet Take 20 mg by mouth daily. 10/06/19   [provider]  benzonatate (TESSALON) 100 MG capsule Take 1 capsule (100 mg total) by mouth 2 (two) times daily as needed for cough. 10/19/19   Mayers, Cari S, PA-C  benztropine (COGENTIN) 0.5 MG tablet Take 0.5 mg by mouth at bedtime.    [provider]  buPROPion (WELLBUTRIN) 75 MG tablet Take 75 mg by mouth 2 (two) times daily.    [provider]  cyclobenzaprine (FLEXERIL) 10 MG tablet Take 10 mg by mouth 3 (three) times daily as needed for muscle spasms (muscle spasms).    [provider]  dexlansoprazole (DEXILANT) 60 MG capsule Take 60 mg by mouth at bedtime.    [provider]  diazepam (VALIUM) 5 MG tablet Take 1 tablet (5 mg total) by mouth 2 (two) times daily. 09/03/16   Isla Pence, MD  fluticasone (FLONASE) 50 MCG/ACT nasal spray Place 2 sprays into the nose daily. 12/11/12   Hess, Hessie Diener, PA-C  furosemide (LASIX) 20 MG tablet Take 20 mg by mouth daily.     [provider]   hydrochlorothiazide (HYDRODIURIL) 25 MG tablet Take 25 mg by mouth daily. 10/06/19   [provider]  ibuprofen (ADVIL,MOTRIN) 600 MG tablet Take 1 tablet (600 mg total) by mouth every 6 (six) hours as needed. 09/03/16   Isla Pence, MD  loratadine (CLARITIN) 10 MG tablet Take 10 mg by mouth daily.    [provider]  lurasidone (LATUDA) 40 MG TABS tablet Take 40 mg by mouth at bedtime.     [provider]  methimazole (TAPAZOLE) 5 MG tablet Take 5 mg by mouth daily. 10/06/19   [provider]  montelukast (SINGULAIR) 10 MG tablet SMARTSIG:1 Tablet(s) By Mouth Every Evening 10/06/19   [provider]  oxymetazoline (AFRIN NASAL SPRAY) 0.05 % nasal spray Place 1 spray into both nostrils 2 (two) times daily. Patient not taking: Reported on 10/19/2019 05/21/15   Nona Dell, PA-C  ranitidine (ZANTAC) 150 MG tablet Take 1 tablet (150 mg total) by mouth 2 (two) times daily. Patient not taking: Reported on 10/19/2019 08/24/17   Ashley Murrain, NP  topiramate (TOPAMAX) 25 MG tablet Take 25 mg by mouth daily.    [provider]  zolpidem (AMBIEN) 10 MG tablet Take 10 mg by mouth at bedtime as needed for sleep (sleep).     [provider]      Allergies    Patient has no known allergies.    Review of Systems  Review of Systems  Physical Exam Updated Vital Signs BP (!) 151/86 (BP Location: Right Arm)   Pulse 100   Temp 98.1 F (36.7 C) (Oral)   Resp 18   LMP 06/14/2014 (Approximate)   SpO2 97%  Physical Exam Vitals and nursing note reviewed.  Constitutional:      General: She is not in acute distress.    Appearance: Normal appearance.  HENT:     Head: Normocephalic and atraumatic.     Nose: Nose normal.     Mouth/Throat:     Mouth: Mucous membranes are moist.     Pharynx: Oropharynx is clear.  Eyes:     Extraocular Movements: Extraocular movements intact.     Conjunctiva/sclera: Conjunctivae normal.   Cardiovascular:     Rate and Rhythm: Regular rhythm. Tachycardia present.     Heart sounds: Normal heart sounds.  Pulmonary:     Effort: Pulmonary effort is normal.     Breath sounds: Normal breath sounds.  Abdominal:     General: Abdomen is flat.     Palpations: Abdomen is soft.     Tenderness: There is abdominal tenderness (Minimal suprapubic tenderness). There is no guarding or rebound.  Musculoskeletal:        General: Normal range of motion.     Cervical back: Normal range of motion and neck supple.  Skin:    General: Skin is warm and dry.  Neurological:     General: No focal deficit present.     Mental Status: She is alert and oriented to person, place, and time.  Psychiatric:        Mood and Affect: Mood normal.        Behavior: Behavior normal.     ED Results / Procedures / Treatments   Labs (all labs ordered are listed, but only abnormal results are displayed) Labs Reviewed  LIPASE, BLOOD - Abnormal; Notable for the following components:      Result Value   Lipase 99 (*)    All other components within normal limits  COMPREHENSIVE METABOLIC PANEL - Abnormal; Notable for the following components:   Glucose, Bld 118 (*)    Total Bilirubin 0.1 (*)    All other components within normal limits  URINALYSIS, ROUTINE W REFLEX MICROSCOPIC - Abnormal; Notable for the following components:   Color, Urine AMBER (*)    APPearance CLOUDY (*)    Hgb urine dipstick LARGE (*)    Protein, ur 100 (*)    RBC / HPF >50 (*)    Bacteria, UA FEW (*)    All other components within normal limits  CBC  TYPE AND SCREEN    EKG None  Radiology US Pelvis Complete  Result Date: 04/26/2022 CLINICAL DATA:  Postmenopausal bleeding EXAM: TRANSABDOMINAL ULTRASOUND OF PELVIS TECHNIQUE: Transabdominal ultrasound examination of the pelvis was performed including evaluation of the uterus, ovaries, adnexal regions, and pelvic cul-de-sac. COMPARISON:  None Available. FINDINGS: Uterus  Measurements: 7.3 x 3.8 x 6.1 cm = volume: 86 mL. Multiple fibroids, the largest measuring up to 2.4 cm. Endometrium Thickness: 10 mm in thickness.  No focal abnormality visualized. Right ovary Measurements: 2.3 x 1.3 x 2.4 cm = volume: 4 mL. Normal appearance/no adnexal mass. Left ovary Measurements: 1.5 x 1.0 x 1.5 cm = volume: 1.2 mL. Normal appearance/no adnexal mass. Other findings:  No abnormal free fluid. IMPRESSION: Multiple uterine fibroids. Endometrium thickened for postmenopausal patient. In the setting of post-menopausal bleeding, endometrial sampling is indicated to exclude carcinoma. If  results are benign, sonohysterogram should be considered for focal lesion work-up. (Ref: Radiological Reasoning: Algorithmic Workup of Abnormal Vaginal Bleeding with Endovaginal Sonography and Sonohysterography. AJR 2008; [id number]:R48-54) Electronically Signed   By: Rolm Baptise M.D.   On: 04/26/2022 20:07    Procedures Procedures    Medications Ordered in ED Medications  acetaminophen (TYLENOL) tablet 650 mg (650 mg Oral Given 04/26/22 2137)  ibuprofen (ADVIL) tablet 600 mg (600 mg Oral Given 04/26/22 2136)    ED Course/ Medical Decision Making/ A&P                             Medical Decision Making This patient presents to the ED with chief complaint(s) of post menopausal vaginal bleeding with pertinent past medical history of HTN, GERD, anxiety which further complicates the presenting complaint. The complaint involves an extensive differential diagnosis and also carries with it a high risk of complications and morbidity.    The differential diagnosis includes anemia, hemorrhage, malignancy, fibroids, other structural abnormality, coagulopathy  Additional history obtained: Additional history obtained from N/A Records reviewed N/a  ED Course and Reassessment: Patient was initially evaluated by provider in triage and had labs and pelvic ultrasound performed.  Labs are within normal range including a  normal hemoglobin and ultrasound showed evidence of a uterine fibroid with thickening of the endometrium.  Patient was offered pelvic exam to assess bleeding that she is having, but states that she would prefer to have outpatient follow-up.  Her heart rate is improving and her blood pressure has remained stable.  She is stable for discharge home with close outpatient OB/GYN follow-up and was given strict return precautions  Independent labs interpretation:  The following labs were independently interpreted: Within normal range  Independent visualization of imaging: - I independently visualized the following imaging with scope of interpretation limited to determining acute life threatening conditions related to emergency care: Ultrasound, which revealed uterine fibroid and endometrial thickening  Consultation: - Consulted or discussed management/test interpretation w/ external professional: N/A  Consideration for admission or further workup: Patient has no emergent conditions requiring admission or further work-up at this time and is stable for discharge home with GYN follow-up  Social Determinants of health: N/A    Risk OTC drugs.          Final Clinical Impression(s) / ED Diagnoses Final diagnoses:  Post-menopausal bleeding  Fibroids  Endometrial thickening on ultrasound    Rx / DC Orders ED Discharge Orders     None         Kemper Durie, DO 04/26/22 2150

## 2022-04-26 NOTE — ED Provider Triage Note (Signed)
Emergency Medicine Provider Triage Evaluation Note  Beth Cole , a 62 y.o. female  was evaluated in triage.  Pt complains of new sudden onset vaginal bleeding since this morning, she reports that she has gone through 2 pads in the last hour, reports that she went through menopause around 8 years ago.  She reports some mild pelvic pain.  She does report that she saw some blood in her stool a few days ago, and she is not taking a blood thinner, no previous history of abnormal coagulation.  She denies dizziness, lightheadedness.  She denies any recent vaginal trauma, discharge, dysuria.  Review of Systems  Positive: Vaginal bleeding, pelvic pain Negative: Fever, chills, lightheadedness  Physical Exam  BP 134/88 (BP Location: Right Arm)   Pulse (!) 113   Temp 98.1 F (36.7 C) (Oral)   Resp (!) 22   LMP 06/14/2014 (Approximate)   SpO2 96%  Gen:   Awake, no distress   Resp:  Normal effort  MSK:   Moves extremities without difficulty  Other:  Minimal ttp suprapubically  Medical Decision Making  Medically screening exam initiated at 6:43 PM.  Appropriate orders placed.  Zarrah Ferguson-Mustapha was informed that the remainder of the evaluation will be completed by another provider, this initial triage assessment does not replace that evaluation, and the importance of remaining in the ED until their evaluation is complete.  Workup initiated   Anselmo Pickler, [place] 04/26/22 1844

## 2022-04-29 ENCOUNTER — Telehealth: Payer: Self-pay

## 2022-04-29 NOTE — Telephone Encounter (Signed)
F/u

## 2022-09-11 ENCOUNTER — Ambulatory Visit
Admission: EM | Admit: 2022-09-11 | Discharge: 2022-09-11 | Disposition: A | Discharge status: Home / Self Care | Payer: 59 | Attending: Internal Medicine | Admitting: Internal Medicine

## 2022-09-11 DIAGNOSIS — R21 Rash and other nonspecific skin eruption: Secondary | ICD-10-CM | POA: Diagnosis not present

## 2022-09-11 DIAGNOSIS — J069 Acute upper respiratory infection, unspecified: Secondary | ICD-10-CM | POA: Diagnosis not present

## 2022-09-11 DIAGNOSIS — R053 Chronic cough: Secondary | ICD-10-CM

## 2022-09-11 MED ORDER — BENZONATATE 100 MG PO CAPS: 100.0000 mg | ORAL_CAPSULE | Freq: Three times a day (TID) | ORAL | 0 refills | Status: DC | PRN

## 2022-09-11 MED ORDER — DOXYCYCLINE HYCLATE 100 MG PO CAPS: 100.0000 mg | ORAL_CAPSULE | Freq: Two times a day (BID) | ORAL | 0 refills | Status: AC

## 2022-09-11 MED ORDER — PREDNISONE 20 MG PO TABS: 40.0000 mg | ORAL_TABLET | Freq: Every day | ORAL | 0 refills | Status: AC
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
## 2022-09-11 NOTE — ED Triage Notes (Signed)
Pt c/o rash all over  and a bad cough with a runny nose x2wks. States taking OTC meds with no relief.

## 2022-09-11 NOTE — Discharge Instructions (Signed)
I have prescribed a 3 different medications to alleviate rash and upper respiratory infection.  Follow-up with any symptoms persist or worsen.

## 2022-10-11 ENCOUNTER — Ambulatory Visit
Admission: EM | Admit: 2022-10-11 | Discharge: 2022-10-11 | Disposition: A | Discharge status: Home / Self Care | Payer: 59

## 2022-10-11 DIAGNOSIS — N950 Postmenopausal bleeding: Secondary | ICD-10-CM

## 2022-10-11 NOTE — ED Provider Notes (Signed)
Patient complains of persistent vaginal bleeding that has been going on for many months.  EMR reviewed, patient was seen in the emergency room in January and advised to follow-up with gynecology for further evaluation of endometrial thickening and postmenopausal bleeding.  Patient states she has been unable to obtain appointment until August of this year.  Patient advised that it is imperative that she is seen as soon as possible and to reach out to the gynecology office where she has her appointment to let them know that she is menopausal and has had vaginal bleeding for longer than 6 months.  Patient agreed to do so.  No billable services were provided for this patient during her visit today.   Theadora Rama Scales, PA-C 10/11/22 1441

## 2022-10-11 NOTE — ED Triage Notes (Signed)
Provider to room. Discussed potential dx and referred pt to [facility] ED for on call gynecology if she is unable to get in to GYN.

## 2022-10-11 NOTE — ED Triage Notes (Signed)
Pt presents with constant vaginal bleeding since January.  States she is now having golf ball sized clots daily. Has appt at GYN but it is over a month away. Reports lightheadedness. States she wants medicine to stop the bleeding and for pain.

## 2022-12-04 ENCOUNTER — Encounter: Payer: Self-pay | Admitting: Obstetrics and Gynecology

## 2022-12-04 ENCOUNTER — Other Ambulatory Visit (HOSPITAL_COMMUNITY)
Admission: RE | Admit: 2022-12-04 | Discharge: 2022-12-04 | Disposition: A | Discharge status: Home / Self Care | Payer: 59 | Source: Ambulatory Visit | Attending: Obstetrics and Gynecology | Admitting: Obstetrics and Gynecology

## 2022-12-04 ENCOUNTER — Ambulatory Visit (INDEPENDENT_AMBULATORY_CARE_PROVIDER_SITE_OTHER): Payer: 59 | Admitting: Obstetrics and Gynecology

## 2022-12-04 VITALS — BP 135/84 | HR 110 | Wt 156.0 lb

## 2022-12-04 DIAGNOSIS — N950 Postmenopausal bleeding: Secondary | ICD-10-CM | POA: Insufficient documentation

## 2022-12-04 DIAGNOSIS — Z1231 Encounter for screening mammogram for malignant neoplasm of breast: Secondary | ICD-10-CM | POA: Insufficient documentation

## 2022-12-04 DIAGNOSIS — D259 Leiomyoma of uterus, unspecified: Secondary | ICD-10-CM

## 2022-12-04 NOTE — Progress Notes (Signed)
Ms Beth Cole presents for PMB. Has had some degree of bleeding daily since Jan U/S in Jan uterine fibroids and thicken endometrium Need pap smear and mammogram  PCP manages HTN and hypothyroidism  PE  AF VSS Chaperone present Lungs clear Heart RRR Abd soft +BS GU Nl EGBUS, pap smear obtained, uterus small, mobile no masses  ENDOMETRIAL BIOPSY     The indications for endometrial biopsy were reviewed.   Risks of the biopsy including cramping, bleeding, infection, uterine perforation, inadequate specimen and need for additional procedures  were discussed. The patient states she understands and agrees to undergo procedure today. Consent was signed. Time out was performed. Urine HCG was negative. During the pelvic exam, the cervix was prepped with Betadine. A single-toothed tenaculum was placed on the anterior lip of the cervix to stabilize it. The 3 mm pipelle was introduced into the endometrial cavity without difficulty to a depth of 7 cm, and a moderate amount of tissue was obtained and sent to pathology. The instruments were removed from the patient's vagina. Minimal bleeding from the cervix was noted. The patient tolerated the procedure well. Routine post-procedure instructions were given to the patient.      A/P PMB        Uterine fibroids           PMB reviewed with pt. EMBX completed today.  Pap completed today Mammogram ordered. Pt will be contact with test results and additional therapy as indicated

## 2022-12-04 NOTE — Progress Notes (Signed)
Pt states having bleeding every day, with clots since January.

## 2022-12-11 LAB — CYTOLOGY - PAP
Comment: NEGATIVE
Diagnosis: UNDETERMINED — AB
High risk HPV: POSITIVE — AB

## 2022-12-11 LAB — SURGICAL PATHOLOGY

## 2022-12-18 ENCOUNTER — Other Ambulatory Visit: Payer: Self-pay | Admitting: Obstetrics and Gynecology

## 2022-12-18 DIAGNOSIS — C541 Malignant neoplasm of endometrium: Secondary | ICD-10-CM

## 2022-12-18 NOTE — Progress Notes (Signed)
Pt made aware of EMBX results and need for referral to GYN Onc

## 2022-12-26 ENCOUNTER — Telehealth: Payer: Self-pay

## 2022-12-26 NOTE — Telephone Encounter (Signed)
Left message for patient to call back and schedule new patient visit.  Trying to schedule for Dr Alvester Morin on 9/17.

## 2022-12-30 ENCOUNTER — Encounter: Payer: Self-pay | Admitting: Psychiatry

## 2022-12-31 ENCOUNTER — Inpatient Hospital Stay (HOSPITAL_BASED_OUTPATIENT_CLINIC_OR_DEPARTMENT_OTHER): Payer: 59 | Admitting: Gynecologic Oncology

## 2022-12-31 ENCOUNTER — Encounter: Payer: Self-pay | Admitting: Psychiatry

## 2022-12-31 ENCOUNTER — Encounter: Payer: Self-pay | Admitting: Oncology

## 2022-12-31 ENCOUNTER — Inpatient Hospital Stay: Payer: 59 | Attending: Psychiatry | Admitting: Psychiatry

## 2022-12-31 ENCOUNTER — Ambulatory Visit (HOSPITAL_COMMUNITY)
Admission: RE | Admit: 2022-12-31 | Discharge: 2022-12-31 | Disposition: A | Discharge status: Home / Self Care | Payer: 59 | Source: Ambulatory Visit | Attending: Psychiatry | Admitting: Psychiatry

## 2022-12-31 VITALS — BP 136/88 | HR 92 | Temp 98.8°F | Resp 18 | Ht 62.0 in | Wt 152.0 lb

## 2022-12-31 DIAGNOSIS — Z720 Tobacco use: Secondary | ICD-10-CM | POA: Diagnosis not present

## 2022-12-31 DIAGNOSIS — C541 Malignant neoplasm of endometrium: Secondary | ICD-10-CM

## 2022-12-31 DIAGNOSIS — R87610 Atypical squamous cells of undetermined significance on cytologic smear of cervix (ASC-US): Secondary | ICD-10-CM | POA: Diagnosis not present

## 2022-12-31 DIAGNOSIS — D259 Leiomyoma of uterus, unspecified: Secondary | ICD-10-CM | POA: Diagnosis not present

## 2022-12-31 DIAGNOSIS — R053 Chronic cough: Secondary | ICD-10-CM | POA: Diagnosis not present

## 2022-12-31 MED ORDER — SENNOSIDES-DOCUSATE SODIUM 8.6-50 MG PO TABS
2.0000 | ORAL_TABLET | Freq: Every day | ORAL | 0 refills | Status: AC
Start: 2022-12-31 — End: ?

## 2022-12-31 MED ORDER — TRAMADOL HCL 50 MG PO TABS
50.0000 mg | ORAL_TABLET | Freq: Four times a day (QID) | ORAL | 0 refills | Status: DC | PRN
Start: 2022-12-31 — End: 2023-03-13

## 2022-12-31 NOTE — Progress Notes (Signed)
Patient here for a pre-operative appointment prior to her scheduled surgery on 01/14/2023. She is scheduled for a robotic assisted total laparoscopic hysterectomy, bilateral salpingo-oophorectomy, sentinel lymph node biopsy, possible lymph node dissection, possible mini laparotomy versus laparotomy. See after visit summary for additional details. Visual aids used to discuss items related to surgery including the incentive spirometer, sequential compression stockings, foley catheter, IV pump, multi-modal pain regimen including tylenol, photo of the surgical robot, female reproductive system to discuss surgery in detail.      Discussed post-op pain management in detail including the aspects of the enhanced recovery pathway.  Advised her that a new prescription would be sent in for Tramadol and it is only to be used for after her upcoming surgery.  We discussed the use of tylenol post-op and to monitor for a maximum of 4,000 mg in a 24 hour period.  Also prescribed sennakot to be used after surgery and to hold if having loose stools.  Discussed bowel regimen in detail.     Discussed the use of SCDs and measures to take at home to prevent DVT including frequent mobility.  Reportable signs and symptoms of DVT discussed. Post-operative instructions discussed and expectations for after surgery. Incisional care discussed as well including reportable signs and symptoms including erythema, drainage, wound separation.     30 minutes spent with the patient.  Verbalizing understanding of material discussed. No needs or concerns voiced at the end of the visit.   Advised patient to call for any needs.  Advised that her post-operative medications had been prescribed and could be picked up at any time.    This appointment is included in the global surgical bundle as pre-operative teaching and has no charge.
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
## 2022-12-31 NOTE — Progress Notes (Signed)
Requested MLH1 Hypermethylation on accession [id number] with [facility] via email.

## 2022-12-31 NOTE — Patient Instructions (Addendum)
Today you had a cervical biopsy. You may have some light spotting after this but call the office for heavier bleeding. Do not place anything in the vagina for at least one week.  Dr. Alvester Morin has ordered a chest xray and we will get this done today before surgery.  Preparing for your Surgery  Plan for surgery on January 14, 2023 with Dr. Clide Cliff at [facility]. You will be scheduled for robotic assisted total laparoscopic hysterectomy (removal of the uterus and cervix), bilateral salpingo-oophorectomy (removal of both ovaries and fallopian tubes), sentinel lymph node biopsy, possible lymph node dissection, possible mini laparotomy versus laparotomy (larger incision on your abdomen if needed).   Pre-operative Testing -You will receive a phone call from presurgical testing at [facility] to arrange for a pre-operative appointment and lab work.  -Bring your insurance card, copy of an advanced directive if applicable, medication list  -At that visit, you will be asked to sign a consent for a possible blood transfusion in case a transfusion becomes necessary during surgery.  The need for a blood transfusion is rare but having consent is a necessary part of your care.     -You should not be taking blood thinners or aspirin at least ten days prior to surgery unless instructed by your surgeon.  -Do not take supplements such as fish oil (omega 3), red yeast rice, turmeric before your surgery. You want to avoid medications with aspirin in them including headache powders such as BC or Goody's), Excedrin migraine.  Day Before Surgery at Home -You will be asked to take in a light diet the day before surgery. You will be advised you can have clear liquids up until 3 hours before your surgery.    Eat a light diet the day before surgery.  Examples including soups, broths, toast, yogurt, mashed potatoes.  AVOID GAS PRODUCING FOODS AND BEVERAGES. Things to avoid include carbonated  beverages (fizzy beverages, sodas), raw fruits and raw vegetables (uncooked), or beans.   If your bowels are filled with gas, your surgeon will have difficulty visualizing your pelvic organs which increases your surgical risks.  Your role in recovery Your role is to become active as soon as directed by your doctor, while still giving yourself time to heal.  Rest when you feel tired. You will be asked to do the following in order to speed your recovery:  - Cough and breathe deeply. This helps to clear and expand your lungs and can prevent pneumonia after surgery.  - STAY ACTIVE WHEN YOU GET HOME. Do mild physical activity. Walking or moving your legs help your circulation and body functions return to normal. Do not try to get up or walk alone the first time after surgery.   -If you develop swelling on one leg or the other, pain in the back of your leg, redness/warmth in one of your legs, please call the office or go to the Emergency Room to have a doppler to rule out a blood clot. For shortness of breath, chest pain-seek care in the Emergency Room as soon as possible. - Actively manage your pain. Managing your pain lets you move in comfort. We will ask you to rate your pain on a scale of zero to 10. It is your responsibility to tell your doctor or nurse where and how much you hurt so your pain can be treated.  Special Considerations -If you are diabetic, you may be placed on insulin after surgery to have closer control  over your blood sugars to promote healing and recovery.  This does not mean that you will be discharged on insulin.  If applicable, your oral antidiabetics will be resumed when you are tolerating a solid diet.  -Your final pathology results from surgery should be available around one week after surgery and the results will be relayed to you when available.  -Dr. Antionette Char is the surgeon that assists your GYN Oncologist with surgery.  If you end up staying the night, the next  day after your surgery you will either see Dr. Pricilla Holm, Dr. Alvester Morin, or Dr. Antionette Char.  -FMLA forms can be faxed to [phone] and please allow 5-7 business days for completion.  Pain Management After Surgery -You have been prescribed your pain medication and bowel regimen medications before surgery so that you can have these available when you are discharged from the hospital. The pain medication is for use ONLY AFTER surgery and a new prescription will not be given.   -Make sure that you have Tylenol and Ibuprofen IF YOU ARE ABLE TO TAKE THESE MEDICATIONS at home to use on a regular basis after surgery for pain control. We recommend alternating the medications every hour to six hours since they work differently and are processed in the body differently for pain relief.  -Review the attached handout on narcotic use and their risks and side effects.   Bowel Regimen -You have been prescribed Sennakot-S to take nightly to prevent constipation especially if you are taking the narcotic pain medication intermittently.  It is important to prevent constipation and drink adequate amounts of liquids. You can stop taking this medication when you are not taking pain medication and you are back on your normal bowel routine.  Risks of Surgery Risks of surgery are low but include bleeding, infection, damage to surrounding structures, re-operation, blood clots, and very rarely death.   Blood Transfusion Information (For the consent to be signed before surgery)  We will be checking your blood type before surgery so in case of emergencies, we will know what type of blood you would need.                                            WHAT IS A BLOOD TRANSFUSION?  A transfusion is the replacement of blood or some of its parts. Blood is made up of multiple cells which provide different functions. Red blood cells carry oxygen and are used for blood loss replacement. White blood cells fight against  infection. Platelets control bleeding. Plasma helps clot blood. Other blood products are available for specialized needs, such as hemophilia or other clotting disorders. BEFORE THE TRANSFUSION  Who gives blood for transfusions?  You may be able to donate blood to be used at a later date on yourself (autologous donation). Relatives can be asked to donate blood. This is generally not any safer than if you have received blood from a stranger. The same precautions are taken to ensure safety when a relative's blood is donated. Healthy volunteers who are fully evaluated to make sure their blood is safe. This is blood bank blood. Transfusion therapy is the safest it has ever been in the practice of medicine. Before blood is taken from a donor, a complete history is taken to make sure that person has no history of diseases nor engages in risky social behavior (examples are intravenous drug  use or sexual activity with multiple partners). The donor's travel history is screened to minimize risk of transmitting infections, such as malaria. The donated blood is tested for signs of infectious diseases, such as HIV and hepatitis. The blood is then tested to be sure it is compatible with you in order to minimize the chance of a transfusion reaction. If you or a relative donates blood, this is often done in anticipation of surgery and is not appropriate for emergency situations. It takes many days to process the donated blood. RISKS AND COMPLICATIONS Although transfusion therapy is very safe and saves many lives, the main dangers of transfusion include:  Getting an infectious disease. Developing a transfusion reaction. This is an allergic reaction to something in the blood you were given. Every precaution is taken to prevent this. The decision to have a blood transfusion has been considered carefully by your caregiver before blood is given. Blood is not given unless the benefits outweigh the risks.  AFTER SURGERY  INSTRUCTIONS  Return to work: 4-6 weeks if applicable  Activity: 1. Be up and out of the bed during the day.  Take a nap if needed.  You may walk up steps but be careful and use the hand rail.  Stair climbing will tire you more than you think, you may need to stop part way and rest.   2. No lifting or straining for 6 weeks over 10 pounds. No pushing, pulling, straining for 6 weeks.  3. No driving for around 1 week(s).  Do not drive if you are taking narcotic pain medicine and make sure that your reaction time has returned.   4. You can shower as soon as the next day after surgery. Shower daily.  Use your regular soap and water (not directly on the incision) and pat your incision(s) dry afterwards; don't rub.  No tub baths or submerging your body in water until cleared by your surgeon. If you have the soap that was given to you by pre-surgical testing that was used before surgery, you do not need to use it afterwards because this can irritate your incisions.   5. No sexual activity and nothing in the vagina for 12 weeks.  6. You may experience a small amount of clear drainage from your incisions, which is normal.  If the drainage persists, increases, or changes color please call the office.  7. Do not use creams, lotions, or ointments such as neosporin on your incisions after surgery until advised by your surgeon because they can cause removal of the dermabond glue on your incisions.    8. You may experience vaginal spotting after surgery or when the stitches at the top of the vagina begin to dissolve.  The spotting is normal but if you experience heavy bleeding, call our office.  9. Take Tylenol or ibuprofen first for pain if you are able to take these medications and only use narcotic pain medication for severe pain not relieved by the Tylenol or Ibuprofen.  Monitor your Tylenol intake to a max of 4,000 mg in a 24 hour period. You can alternate these medications after surgery.  Diet: 1. Low  sodium Heart Healthy Diet is recommended but you are cleared to resume your normal (before surgery) diet after your procedure.  2. It is safe to use a laxative, such as Miralax or Colace, if you have difficulty moving your bowels. You have been prescribed Sennakot-S to take at bedtime every evening after surgery to keep bowel movements regular and  to prevent constipation.    Wound Care: 1. Keep clean and dry.  Shower daily.  Reasons to call the Doctor: Fever - Oral temperature greater than 100.4 degrees Fahrenheit Foul-smelling vaginal discharge Difficulty urinating Nausea and vomiting Increased pain at the site of the incision that is unrelieved with pain medicine. Difficulty breathing with or without chest pain New calf pain especially if only on one side Sudden, continuing increased vaginal bleeding with or without clots.   Contacts: For questions or concerns you should contact:  Dr. Clide Cliff at [phone]  [name], NP at [phone]  After Hours: call [phone] and have the GYN Oncologist paged/contacted (after 5 pm or on the weekends). You will speak with an after hours RN and let he or she know you have had surgery.  Messages sent via mychart are for non-urgent matters and are not responded to after hours so for urgent needs, please call the after hours number.

## 2023-01-02 ENCOUNTER — Emergency Department (HOSPITAL_COMMUNITY)
Admission: EM | Admit: 2023-01-02 | Discharge: 2023-01-02 | Disposition: A | Discharge status: Home / Self Care | Payer: 59 | Attending: Emergency Medicine | Admitting: Emergency Medicine

## 2023-01-02 DIAGNOSIS — E039 Hypothyroidism, unspecified: Secondary | ICD-10-CM | POA: Diagnosis not present

## 2023-01-02 DIAGNOSIS — Z20822 Contact with and (suspected) exposure to covid-19: Secondary | ICD-10-CM | POA: Insufficient documentation

## 2023-01-02 DIAGNOSIS — I10 Essential (primary) hypertension: Secondary | ICD-10-CM | POA: Insufficient documentation

## 2023-01-02 DIAGNOSIS — Z79899 Other long term (current) drug therapy: Secondary | ICD-10-CM | POA: Diagnosis not present

## 2023-01-02 DIAGNOSIS — J0110 Acute frontal sinusitis, unspecified: Secondary | ICD-10-CM | POA: Diagnosis not present

## 2023-01-02 DIAGNOSIS — R0981 Nasal congestion: Secondary | ICD-10-CM | POA: Diagnosis not present

## 2023-01-02 DIAGNOSIS — Z8542 Personal history of malignant neoplasm of other parts of uterus: Secondary | ICD-10-CM | POA: Diagnosis not present

## 2023-01-02 LAB — RESP PANEL BY RT-PCR (RSV, FLU A&B, COVID)  RVPGX2
Influenza A by PCR: NEGATIVE
Influenza B by PCR: NEGATIVE
Resp Syncytial Virus by PCR: NEGATIVE
SARS Coronavirus 2 by RT PCR: NEGATIVE

## 2023-01-02 MED ORDER — AMOXICILLIN-POT CLAVULANATE 875-125 MG PO TABS: 1.0000 | ORAL_TABLET | Freq: Two times a day (BID) | ORAL | 0 refills | Status: DC

## 2023-01-02 NOTE — Discharge Instructions (Addendum)
You were seen in the ER for sinus congestion and cough.  Your COVID, flu, and RSV testing was negative. Since your symptoms having been going on for > 10 days, it is likely it is caused by a bacterial infection. I have sent antibiotics to your pharmacy.  Continue to monitor how you're doing and return to the ER for new or worsening symptoms.

## 2023-01-02 NOTE — Progress Notes (Addendum)
Anesthesia Review:  [id number]  Cardiologist : none  Chest x-ray : 12/31/22- 1 view  and 01/07/23  EKG : 01/07/23  Echo : Stress test: Cardiac Cath :  Activity level: can do a flight of stairs without difficutly  Sleep Study/ CPAP : none  Fasting Blood Sugar :      / Checks Blood Sugar -- times a day:   Blood Thinner/ Instructions /Last Dose: ASA / Instructions/ Last Dose :    PreDM-  Requested orders on 01/02/23.   Smoker    In ED for sinusitis on 01/02/23.  Resp Panel done- negative.  PT reports at preop on 01/07/23 that sinusitis is no better.  " Medicine given in ED is not working.  " PT has cough.   CXR done per order on 01/07/23. Shanda Bumps [facility] aware.    PT placed on blood consent form only initials.  Will have resign DOS.  New consent form on front of chart.     AT preop appt pt seemed in a hurry to be done with preop appt.   Someone called over phone x 2 to see if she was ready to be picked up.  Med hx and preop instrructions completed at preop appt along with labs.

## 2023-01-02 NOTE — Patient Instructions (Addendum)
SURGICAL WAITING ROOM VISITATION  Patients having surgery or a procedure may have no more than 2 support people in the waiting area - these visitors may rotate.    Children under the age of 14 must have an adult with them who is not the patient.  Due to an increase in RSV and influenza rates and associated hospitalizations, children ages 44 and under may not visit patients in [facility] hospitals.  If the patient needs to stay at the hospital during part of their recovery, the visitor guidelines for inpatient rooms apply. Pre-op nurse will coordinate an appropriate time for 1 support person to accompany patient in pre-op.  This support person may not rotate.    Please refer to the [facility] website for the visitor guidelines for Inpatients (after your surgery is over and you are in a regular room).       Your procedure is scheduled on:  01/14/2023    Report to [facility] Main Entrance    Report to admitting at   1230 pm    Call this number if you have problems the morning of surgery [phone]              Eat a light diet the day before surgery. Avoid gas producing foods.     Do not eat food :After Midnight.   After Midnight you may have the following liquids until _ 1130_____ AM  DAY OF SURGERY  Water Non-Citrus Juices (without pulp, NO RED-Apple, White grape, White cranberry) Black Coffee (NO MILK/CREAM OR CREAMERS, sugar ok)  Clear Tea (NO MILK/CREAM OR CREAMERS, sugar ok) regular and decaf                             Plain Jell-O (NO RED)                                           Fruit ices (not with fruit pulp, NO RED)                                     Popsicles (NO RED)                                                               Sports drinks like Gatorade (NO RED)                           If you have questions, please contact your surgeon's office.      Oral Hygiene is also important to reduce your risk of infection.                                     Remember - BRUSH YOUR TEETH THE MORNING OF SURGERY WITH YOUR REGULAR TOOTHPASTE  DENTURES WILL BE REMOVED PRIOR TO SURGERY PLEASE DO NOT APPLY "Poly grip" OR ADHESIVES!!!   Do NOT smoke after Midnight   Stop all vitamins and herbal supplements  7 days before surgery.   Take these medicines the morning of surgery with A SIP OF WATER:  claritin, tapazole   DO NOT TAKE ANY ORAL DIABETIC MEDICATIONS DAY OF YOUR SURGERY  Bring CPAP mask and tubing day of surgery.                              You may not have any metal on your body including hair pins, jewelry, and body piercing             Do not wear make-up, lotions, powders, perfumes/cologne, or deodorant  Do not wear nail polish including gel and S&S, artificial/acrylic nails, or any other type of covering on natural nails including finger and toenails. If you have artificial nails, gel coating, etc. that needs to be removed by a nail salon please have this removed prior to surgery or surgery may need to be canceled/ delayed if the surgeon/ anesthesia feels like they are unable to be safely monitored.   Do not shave  48 hours prior to surgery.               Men may shave face and neck.   Do not bring valuables to the hospital. [facility] IS NOT             RESPONSIBLE   FOR VALUABLES.   Contacts, glasses, dentures or bridgework may not be worn into surgery.   Bring small overnight bag day of surgery.   DO NOT BRING YOUR HOME MEDICATIONS TO THE HOSPITAL. PHARMACY WILL DISPENSE MEDICATIONS LISTED ON YOUR MEDICATION LIST TO YOU DURING YOUR ADMISSION IN THE HOSPITAL!    Patients discharged on the day of surgery will not be allowed to drive home.  Someone NEEDS to stay with you for the first 24 hours after anesthesia.   Special Instructions: Bring a copy of your healthcare power of attorney and living will documents the day of surgery if you haven't scanned them before.              Please read over the following fact sheets  you were given: IF YOU HAVE QUESTIONS ABOUT YOUR PRE-OP INSTRUCTIONS PLEASE CALL [phone]   If you received a COVID test during your pre-op visit  it is requested that you wear a mask when out in public, stay away from anyone that may not be feeling well and notify your surgeon if you develop symptoms. If you test positive for Covid or have been in contact with anyone that has tested positive in the last 10 days please notify you surgeon.    [facility] - Preparing for Surgery Before surgery, you can play an important role.  Because skin is not sterile, your skin needs to be as free of germs as possible.  You can reduce the number of germs on your skin by washing with CHG (chlorahexidine gluconate) soap before surgery.  CHG is an antiseptic cleaner which kills germs and bonds with the skin to continue killing germs even after washing. Please DO NOT use if you have an allergy to CHG or antibacterial soaps.  If your skin becomes reddened/irritated stop using the CHG and inform your nurse when you arrive at Short Stay. Do not shave (including legs and underarms) for at least 48 hours prior to the first CHG shower.  You may shave your face/neck. Please follow these instructions carefully:  1.  Shower with CHG Soap the night before  surgery and the  morning of Surgery.  2.  If you choose to wash your hair, wash your hair first as usual with your  normal  shampoo.  3.  After you shampoo, rinse your hair and body thoroughly to remove the  shampoo.                           4.  Use CHG as you would any other liquid soap.  You can apply chg directly  to the skin and wash                       Gently with a scrungie or clean washcloth.  5.  Apply the CHG Soap to your body ONLY FROM THE NECK DOWN.   Do not use on face/ open                           Wound or open sores. Avoid contact with eyes, ears mouth and genitals (private parts).                       Wash face,  Genitals (private parts) with your  normal soap.             6.  Wash thoroughly, paying special attention to the area where your surgery  will be performed.  7.  Thoroughly rinse your body with warm water from the neck down.  8.  DO NOT shower/wash with your normal soap after using and rinsing off  the CHG Soap.                9.  Pat yourself dry with a clean towel.            10.  Wear clean pajamas.            11.  Place clean sheets on your bed the night of your first shower and do not  sleep with pets. Day of Surgery : Do not apply any lotions/deodorants the morning of surgery.  Please wear clean clothes to the hospital/surgery center.  FAILURE TO FOLLOW THESE INSTRUCTIONS MAY RESULT IN THE CANCELLATION OF YOUR SURGERY PATIENT SIGNATURE_________________________________  NURSE SIGNATURE__________________________________  ________________________________________________________________________

## 2023-01-02 NOTE — ED Provider Triage Note (Signed)
Emergency Medicine Provider Triage Evaluation Note  Beth Cole , a 61 y.o. female  was evaluated in triage.  Pt complains of nasal congestion, sinus pain, and productive cough x 2 weeks. Hx of endometrial cancer, scheduled for surgery on 10/1. Having hot flashes, but not sure what it is coming from  Review of Systems  Positive: As above Negative: fever  Physical Exam  BP (!) 128/91 (BP Location: Right Arm)   Pulse (!) 104   Temp 98.4 F (36.9 C) (Oral)   Resp 16   LMP 06/14/2014 (Approximate)   SpO2 98%  Gen:   Awake, no distress   Resp:  Normal effort  MSK:   Moves extremities without difficulty  Other:    Medical Decision Making  Medically screening exam initiated at 5:34 PM.  Appropriate orders placed.  Beth Cole was informed that the remainder of the evaluation will be completed by another provider, this initial triage assessment does not replace that evaluation, and the importance of remaining in the ED until their evaluation is complete.  Per chart review, CXR on 9/17 with no acute abnormalities. Pt does not want to repeat today   Tache Bobst T, PA-C 01/02/23 1736

## 2023-01-02 NOTE — ED Provider Notes (Signed)
[place] EMERGENCY DEPARTMENT AT [facility] Provider Note   CSN: [id number] Arrival date & time: 01/02/23  1703     History  Chief Complaint  Patient presents with   Cough   Facial Pain   Nasal Congestion    Beth Cole is a 62 y.o. female.  Pt complains of nasal congestion, sinus pain, and productive cough x 2 weeks. Hx of endometrial cancer, scheduled for surgery on 10/1. Having hot flashes, but not sure what it is coming from. Had chest x-ray two days ago that was normal, she does not want to repeat this. Having thick brown and green mucus when she sneezes.    Cough Associated symptoms: no fever and no shortness of breath        Home Medications Prior to Admission medications   Medication Sig Start Date End Date Taking? Authorizing Provider  amoxicillin-clavulanate (AUGMENTIN) 875-125 MG tablet Take 1 tablet by mouth every 12 (twelve) hours. 01/02/23  Yes Sevastian Witczak T, PA-C  hydrochlorothiazide (HYDRODIURIL) 25 MG tablet Take 25 mg by mouth daily. 10/06/19   [provider]  loratadine (CLARITIN) 10 MG tablet Take 10 mg by mouth daily.    [provider]  methimazole (TAPAZOLE) 5 MG tablet Take 5 mg by mouth 3 (three) times daily.    [provider]  senna-docusate (SENOKOT-S) 8.6-50 MG tablet Take 2 tablets by mouth at bedtime. For AFTER surgery, do not take if having diarrhea 12/31/22   [name] D, NP  traMADol (ULTRAM) 50 MG tablet Take 1 tablet (50 mg total) by mouth every 6 (six) hours as needed for severe pain. For AFTER surgery only, do not take and drive 7/82/95   [name] D, NP      Allergies    Patient has no known allergies.    Review of Systems   Review of Systems  Constitutional:  Negative for fever.  HENT:  Positive for congestion and sinus pressure.   Respiratory:  Positive for cough. Negative for shortness of breath.   All other systems reviewed and are negative.   Physical  Exam Updated Vital Signs BP 130/77   Pulse 93   Temp 97.9 F (36.6 C)   Resp 18   LMP 06/14/2014 (Approximate)   SpO2 96%  Physical Exam Vitals and nursing note reviewed.  Constitutional:      Appearance: Normal appearance.  HENT:     Head: Normocephalic and atraumatic.     Nose: Congestion present.     Right Sinus: Frontal sinus tenderness present.     Left Sinus: Frontal sinus tenderness present.  Eyes:     Conjunctiva/sclera: Conjunctivae normal.  Cardiovascular:     Rate and Rhythm: Normal rate and regular rhythm.  Pulmonary:     Effort: Pulmonary effort is normal. No respiratory distress.     Breath sounds: Normal breath sounds.  Abdominal:     General: There is no distension.     Palpations: Abdomen is soft.     Tenderness: There is no abdominal tenderness.  Skin:    General: Skin is warm and dry.  Neurological:     General: No focal deficit present.     Mental Status: She is alert.     ED Results / Procedures / Treatments   Labs (all labs ordered are listed, but only abnormal results are displayed) Labs Reviewed  RESP PANEL BY RT-PCR (RSV, FLU A&B, COVID)  RVPGX2    EKG None  Radiology No results found.  Procedures Procedures    Medications Ordered in ED Medications - No data to display  ED Course/ Medical Decision Making/ A&P                                 Medical Decision Making Risk Prescription drug management.   This patient is a 62 y.o. female  who presents to the ED for concern of nasal congestion and cough.   Differential diagnoses prior to evaluation: The emergent differential diagnosis includes, but is not limited to,  upper respiratory infection, lower respiratory infection, allergies, asthma, irritants, sinus/esophageal foreign body, medications, reflux, CHF, lung cancer, interstitial lung disease, postnasal drip, viral illness. This is not an exhaustive differential.   Past Medical History / Co-morbidities / Social  History: Sleep apnea, GERD, chronic back pain, anxiety, depression, TMJ, hypertension, hypothyroidism, prediabetes, endometrial cancer  Additional history: Chart reviewed. Pertinent results include: Scheduled for total hysterectomy on 10/1  Chest x-ray from 9/17 without acute abnormalities  Physical Exam: Physical exam performed. The pertinent findings include: Normal vital signs, no acute distress.  Normal respiratory effort, lung sounds clear.  Sinus tenderness.  Lab Tests/Imaging studies: I personally interpreted labs/imaging and the pertinent results include: Respiratory panel negative.    Disposition: After consideration of the diagnostic results and the patients response to treatment, I feel that emergency department workup does not suggest an emergent condition requiring admission or immediate intervention beyond what has been performed at this time. The plan is: Discharged home with antibiotics for likely acute bacterial sinusitis.  Symptoms have been going on for over 10 days.  Negative viral panel.  Clinically well-appearing, does not meet SIRS. The patient is safe for discharge and has been instructed to return immediately for worsening symptoms, change in symptoms or any other concerns.  Final Clinical Impression(s) / ED Diagnoses Final diagnoses:  Acute non-recurrent frontal sinusitis    Rx / DC Orders ED Discharge Orders          Ordered    amoxicillin-clavulanate (AUGMENTIN) 875-125 MG tablet  Every 12 hours        01/02/23 2124           Portions of this report may have been transcribed using voice recognition software. Every effort was made to ensure accuracy; however, inadvertent computerized transcription errors may be present.    Jeanella Flattery 01/02/23 2333    Lonell Grandchild, MD 01/07/23 [phone]

## 2023-01-02 NOTE — ED Triage Notes (Signed)
Patient here from home reporting cough, nasal congestion, sinus pain x2 weeks. Surgery scheduled soon.

## 2023-01-03 NOTE — Progress Notes (Signed)
Sent message, via epic in basket, requesting orders in epic from surgeon.  

## 2023-01-07 ENCOUNTER — Ambulatory Visit (HOSPITAL_COMMUNITY)
Admission: RE | Admit: 2023-01-07 | Discharge: 2023-01-07 | Disposition: A | Discharge status: Home / Self Care | Payer: 59 | Source: Ambulatory Visit | Attending: Gynecologic Oncology | Admitting: Gynecologic Oncology

## 2023-01-07 ENCOUNTER — Encounter (HOSPITAL_COMMUNITY)
Admission: RE | Admit: 2023-01-07 | Discharge: 2023-01-07 | Disposition: A | Discharge status: Home / Self Care | Payer: 59 | Source: Ambulatory Visit | Attending: Family Medicine | Admitting: Family Medicine

## 2023-01-07 ENCOUNTER — Encounter (HOSPITAL_COMMUNITY): Payer: Self-pay

## 2023-01-07 ENCOUNTER — Other Ambulatory Visit: Payer: Self-pay

## 2023-01-07 VITALS — BP 132/96 | HR 108 | Temp 98.6°F | Resp 16 | Ht 63.0 in | Wt 151.9 lb

## 2023-01-07 DIAGNOSIS — R059 Cough, unspecified: Secondary | ICD-10-CM | POA: Diagnosis not present

## 2023-01-07 DIAGNOSIS — I10 Essential (primary) hypertension: Secondary | ICD-10-CM | POA: Insufficient documentation

## 2023-01-07 DIAGNOSIS — R9431 Abnormal electrocardiogram [ECG] [EKG]: Secondary | ICD-10-CM | POA: Diagnosis not present

## 2023-01-07 DIAGNOSIS — Z01818 Encounter for other preprocedural examination: Secondary | ICD-10-CM

## 2023-01-07 DIAGNOSIS — Z01810 Encounter for preprocedural cardiovascular examination: Secondary | ICD-10-CM | POA: Diagnosis not present

## 2023-01-07 DIAGNOSIS — R918 Other nonspecific abnormal finding of lung field: Secondary | ICD-10-CM | POA: Diagnosis not present

## 2023-01-07 DIAGNOSIS — Z01812 Encounter for preprocedural laboratory examination: Secondary | ICD-10-CM | POA: Insufficient documentation

## 2023-01-07 DIAGNOSIS — C541 Malignant neoplasm of endometrium: Secondary | ICD-10-CM | POA: Insufficient documentation

## 2023-01-07 DIAGNOSIS — Z79899 Other long term (current) drug therapy: Secondary | ICD-10-CM | POA: Diagnosis not present

## 2023-01-07 DIAGNOSIS — I700 Atherosclerosis of aorta: Secondary | ICD-10-CM | POA: Diagnosis not present

## 2023-01-07 HISTORY — DX: Anemia, unspecified: D64.9

## 2023-01-07 HISTORY — DX: Malignant (primary) neoplasm, unspecified: C80.1

## 2023-01-07 LAB — COMPREHENSIVE METABOLIC PANEL
ALT: 19 U/L (ref 0–44)
AST: 17 U/L (ref 15–41)
Albumin: 3.6 g/dL (ref 3.5–5.0)
Alkaline Phosphatase: 73 U/L (ref 38–126)
Anion gap: 9 (ref 5–15)
BUN: 9 mg/dL (ref 6–20)
CO2: 26 mmol/L (ref 22–32)
Calcium: 9.2 mg/dL (ref 8.9–10.3)
Chloride: 105 mmol/L (ref 98–111)
Creatinine, Ser: 0.5 mg/dL (ref 0.44–1.00)
GFR, Estimated: >60 mL/min (ref >60)
Glucose, Bld: 88 mg/dL (ref 70–99)
Potassium: 3.6 mmol/L (ref 3.5–5.1)
Sodium: 140 mmol/L (ref 135–145)
Total Bilirubin: 0.4 mg/dL (ref 0.3–1.2)
Total Protein: 7.3 g/dL (ref 6.5–8.1)

## 2023-01-07 LAB — CBC
HCT: 39.3 % (ref 36.0–46.0)
Hemoglobin: 12.1 g/dL (ref 12.0–15.0)
MCH: 26.2 pg (ref 26.0–34.0)
MCHC: 30.8 g/dL (ref 30.0–36.0)
MCV: 85.1 fL (ref 80.0–100.0)
Platelets: 399 10*3/uL (ref 150–400)
RBC: 4.62 MIL/uL (ref 3.87–5.11)
RDW: 13.5 % (ref 11.5–15.5)
WBC: 9.7 10*3/uL (ref 4.0–10.5)
nRBC: 0 % (ref 0.0–0.2)

## 2023-01-07 LAB — TYPE AND SCREEN
ABO/RH(D): O POS
Antibody Screen: NEGATIVE

## 2023-01-08 ENCOUNTER — Telehealth: Payer: Self-pay | Admitting: *Deleted

## 2023-01-08 ENCOUNTER — Ambulatory Visit (HOSPITAL_COMMUNITY): Payer: 59 | Admitting: Anesthesiology

## 2023-01-08 ENCOUNTER — Ambulatory Visit (HOSPITAL_COMMUNITY): Payer: 59 | Admitting: Physician Assistant

## 2023-01-08 NOTE — Telephone Encounter (Signed)
Spoke with Beth Cole and patient states she doesn't have a cough but she has nasal congestion. Pt states she had been smoking 1 pack of cigarettes a day and she is down to 3 cigarettes a day now. Advised patient that we heard from anesthesia this am about her surgery. They are saying pt has to be at least two weeks asymptomatic from her respiratory infection before proceeding with surgery. This means we are going to have to adjust the date of surgery. Advised patient that would relay this to Dr. Alvester Morin and the office would call back about a new date for surgery.
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
## 2023-01-08 NOTE — Anesthesia Preprocedure Evaluation (Addendum)
Anesthesia Evaluation  Patient identified by MRN, date of birth, ID band Patient awake    Reviewed: Allergy & Precautions, NPO status , Patient's Chart, lab work & pertinent test results  Airway Mallampati: II  TM Distance: >3 FB Neck ROM: Full    Dental  (+) Teeth Intact, Dental Advisory Given   Pulmonary Current Smoker   breath sounds clear to auscultation       Cardiovascular hypertension, Pt. on medications  Rhythm:Regular Rate:Normal     Neuro/Psych  PSYCHIATRIC DISORDERS Anxiety Depression Bipolar Disorder      GI/Hepatic Neg liver ROS,GERD  ,,  Endo/Other   Hyperthyroidism   Renal/GU Renal disease     Musculoskeletal negative musculoskeletal ROS (+)    Abdominal   Peds  Hematology  (+) Blood dyscrasia, anemia   Anesthesia Other Findings   Reproductive/Obstetrics                             Anesthesia Physical Anesthesia Plan  ASA: 2  Anesthesia Plan: General   Post-op Pain Management: Tylenol PO (pre-op)*   Induction: Intravenous  PONV Risk Score and Plan: 3 and Ondansetron, Dexamethasone and Midazolam  Airway Management Planned: Oral ETT  Additional Equipment: None  Intra-op Plan:   Post-operative Plan: Extubation in OR  Informed Consent: I have reviewed the patients History and Physical, chart, labs and discussed the procedure including the risks, benefits and alternatives for the proposed anesthesia with the patient or authorized representative who has indicated his/her understanding and acceptance.     Dental advisory given  Plan Discussed with: CRNA  Anesthesia Plan Comments: (See PAT note 01/07/23  - 2 IV's)       Anesthesia Quick Evaluation

## 2023-01-08 NOTE — Telephone Encounter (Signed)
-----   Message from Doylene Bode sent at 01/08/2023 11:18 AM EDT ----- We heard from anesthesia this am about her surgery. They are saying she has to be at least 2 weeks asymptomatic from respiratory infection before proceeding with surgery. This means we are going to have to adjust the date of her surgery.  Dr. Alvester Morin is wanting to know if she stopped or cut back on smoking. At her appt, Dr. Alvester Morin told her stopping is in her best interest but STOPPING NOW SOMETIMES CAN CAUSE MORE INFLAMMATION so we are trying to see if this may have contributed to her cough/symptoms.  We will plan on updating Dr. Alvester Morin with the above information and will call and let her know if and when her surgery will be moved to.

## 2023-01-09 NOTE — Telephone Encounter (Signed)
Spoke with Ms. Beth Cole and relayed message from [name], NP that pt's surgery has been rescheduled for Tuesday, October 15 th. With Dr. Alvester Morin. Pt was informed to let the office know if her respiratory symptoms continue or worsen. Pt verbalized understanding.

## 2023-01-10 ENCOUNTER — Encounter (HOSPITAL_COMMUNITY): Payer: Self-pay

## 2023-01-13 NOTE — Progress Notes (Signed)
PT surgery was moved from 01/14/2023 unitl 01/28/2023.  Chart restickered.  Type and Screen will need to be redone DOS.  Placed on front of chart.  PT called and no new updates on medical hx.  No changes in meds.   PT stated surgery was delayed due to resp illness that she had on preop of 01/07/23.  PT stated that MD delayed surgery for 2 weeks.  PT states cough is practically gone and hse has developed no other symptoms.

## 2023-01-14 DIAGNOSIS — C541 Malignant neoplasm of endometrium: Secondary | ICD-10-CM

## 2023-01-21 ENCOUNTER — Other Ambulatory Visit: Payer: Self-pay | Admitting: Gynecologic Oncology

## 2023-01-21 ENCOUNTER — Telehealth: Payer: Self-pay | Admitting: Oncology

## 2023-01-21 DIAGNOSIS — R1031 Right lower quadrant pain: Secondary | ICD-10-CM

## 2023-01-21 DIAGNOSIS — C541 Malignant neoplasm of endometrium: Secondary | ICD-10-CM

## 2023-01-21 NOTE — Telephone Encounter (Signed)
Spoke with Ms. Mustapha who states she is having a pink discharge that she is wearing a pad for and changing her pad twice a day for hygiene reasons only.  Pt is also complaining of right lower pelvic pain that she rates "20/10" "hurts really bad"  "like it's a show stopper" the pain is not constant, it comes and goes and it's sharp. Pt denies all urinary symptoms, is passing gas and having daily bowel movements. Pt also denies nausea and vomiting, fever and or chills.  Pt has a non-productive cough, and states has not had a cigarette in days and states, "I need a cigarette" "no one will bring me a cigarette"  Pt's message was relayed to [name], NP and advised patient that she can try the tramadol for pain and to not take the ibuprofen as much since she is having surgery on Tuesday, Oct. 15 th. Also advised patient to try a heating pad on her abdomen. Pt was also made a lab appt. For tomorrow, Oct. 9 th. At 1000. Pt agreed to date and time.

## 2023-01-21 NOTE — Telephone Encounter (Signed)
Beth Cole called and said that the vaginal bleeding stopped after her biopsy on 01/01/2023 and now she is having a lot of pink colored, stringy mucus.  She is wondering if this is normal.    She is also reporting having pain in her right pelvis/vaginal area that makes her double over.  She is rating it a "20" out of 10.  She has 600 mg ibuprofen tablets from another physician and took 3 yesterday to get through the day.  She has 2 tablets left. She is concerned about the Tramadol prescribed for post op because she said is doesn't work for her.  She denies having a fever but does feel "clammy."  She is urinating without any issues and denies any issues with bowel movements.  She reports the cold she had is gone but she still has a cough.

## 2023-01-22 ENCOUNTER — Inpatient Hospital Stay: Payer: 59 | Attending: Psychiatry

## 2023-01-22 NOTE — Telephone Encounter (Signed)
Ms. Beth Cole called the office this morning to cancel her lab appointment that was scheduled for today at 1000. Pt states she doesn't feel the need to follow up with the labs and she states, "I am feeling better"  Lab appt. Canceled and advised pt her message would be relayed to provider.

## 2023-01-27 ENCOUNTER — Telehealth: Payer: Self-pay | Admitting: *Deleted

## 2023-01-27 NOTE — Telephone Encounter (Signed)
Telephone call to check on pre-operative status.  Patient compliant with pre-operative instructions.  Reinforced nothing to eat after midnight. Clear liquids until 1045. Patient to arrive at 1145.  No questions or concerns voiced.  Instructed to call for any needs.

## 2023-01-28 ENCOUNTER — Ambulatory Visit (HOSPITAL_COMMUNITY)
Admission: RE | Admit: 2023-01-28 | Discharge: 2023-01-28 | Disposition: A | Discharge status: Home / Self Care | Payer: 59 | Source: Ambulatory Visit | Attending: Psychiatry | Admitting: Psychiatry

## 2023-01-28 ENCOUNTER — Telehealth: Payer: Self-pay | Admitting: *Deleted

## 2023-01-28 ENCOUNTER — Encounter (HOSPITAL_COMMUNITY)
Admission: RE | Disposition: A | Discharge status: Home / Self Care | Payer: Self-pay | Source: Ambulatory Visit | Attending: Psychiatry

## 2023-01-28 DIAGNOSIS — Z539 Procedure and treatment not carried out, unspecified reason: Secondary | ICD-10-CM | POA: Diagnosis not present

## 2023-01-28 DIAGNOSIS — C541 Malignant neoplasm of endometrium: Secondary | ICD-10-CM | POA: Insufficient documentation

## 2023-01-28 DIAGNOSIS — Z01818 Encounter for other preprocedural examination: Secondary | ICD-10-CM

## 2023-01-28 SURGERY — HYSTERECTOMY, TOTAL, ROBOT-ASSISTED, LAPAROSCOPIC, WITH BILATERAL SALPINGO-OOPHORECTOMY
Anesthesia: General

## 2023-01-28 MED ORDER — MEGESTROL ACETATE 40 MG PO TABS: 40.0000 mg | ORAL_TABLET | Freq: Two times a day (BID) | ORAL | 1 refills | Status: DC

## 2023-01-28 NOTE — Telephone Encounter (Signed)
Attempted to reach patient to relay message from [name], NP in regards to her new surgery date. Left voicemail requesting call back.

## 2023-01-28 NOTE — Progress Notes (Signed)
Patient ate Pecan pie at 0800 per anesthesia delaryed 8 hours

## 2023-01-28 NOTE — Progress Notes (Addendum)
Dr. Alvester Morin canceling surgery. Pt refusing to let us obtain last set of vital signs before leaving . Pt alert and oriented x 4 , no s/s of distress noted

## 2023-01-28 NOTE — Discharge Instructions (Signed)
AFTER SURGERY INSTRUCTIONS   Return to work: 4-6 weeks if applicable   Activity: 1. Be up and out of the bed during the day.  Take a nap if needed.  You may walk up steps but be careful and use the hand rail.  Stair climbing will tire you more than you think, you may need to stop part way and rest.    2. No lifting or straining for 6 weeks over 10 pounds. No pushing, pulling, straining for 6 weeks.   3. No driving for around 1 week(s).  Do not drive if you are taking narcotic pain medicine and make sure that your reaction time has returned.    4. You can shower as soon as the next day after surgery. Shower daily.  Use your regular soap and water (not directly on the incision) and pat your incision(s) dry afterwards; don't rub.  No tub baths or submerging your body in water until cleared by your surgeon. If you have the soap that was given to you by pre-surgical testing that was used before surgery, you do not need to use it afterwards because this can irritate your incisions.    5. No sexual activity and nothing in the vagina for 12 weeks.   6. You may experience a small amount of clear drainage from your incisions, which is normal.  If the drainage persists, increases, or changes color please call the office.   7. Do not use creams, lotions, or ointments such as neosporin on your incisions after surgery until advised by your surgeon because they can cause removal of the dermabond glue on your incisions.     8. You may experience vaginal spotting after surgery or when the stitches at the top of the vagina begin to dissolve.  The spotting is normal but if you experience heavy bleeding, call our office.   9. Take Tylenol or ibuprofen first for pain if you are able to take these medications and only use narcotic pain medication for severe pain not relieved by the Tylenol or Ibuprofen.  Monitor your Tylenol intake to a max of 4,000 mg in a 24 hour period. You can alternate these medications after  surgery.   Diet: 1. Low sodium Heart Healthy Diet is recommended but you are cleared to resume your normal (before surgery) diet after your procedure.   2. It is safe to use a laxative, such as Miralax or Colace, if you have difficulty moving your bowels. You have been prescribed Sennakot-S to take at bedtime every evening after surgery to keep bowel movements regular and to prevent constipation.     Wound Care: 1. Keep clean and dry.  Shower daily.   Reasons to call the Doctor: Fever - Oral temperature greater than 100.4 degrees Fahrenheit Foul-smelling vaginal discharge Difficulty urinating Nausea and vomiting Increased pain at the site of the incision that is unrelieved with pain medicine. Difficulty breathing with or without chest pain New calf pain especially if only on one side Sudden, continuing increased vaginal bleeding with or without clots.   Contacts: For questions or concerns you should contact:   Dr. Clide Cliff at [phone]   [name], NP at [phone]   After Hours: call [phone] and have the GYN Oncologist paged/contacted (after 5 pm or on the weekends). You will speak with an after hours RN and let he or she know you have had surgery.   Messages sent via mychart are for non-urgent matters and are not responded to after  hours so for urgent needs, please call the after hours number.

## 2023-01-28 NOTE — Telephone Encounter (Signed)
-----   Message from Doylene Bode sent at 01/28/2023  4:16 PM EDT ----- This patient ate this am. Her surgery has been rescheduled to November 26. Please let her know and she needs to keep taking megace during this time. She will need a new post-op made.   Please advise her that megace is a hormone and she needs to monitor for symptoms of blood clots esp if she is traveling to [place] (she should move around freq).

## 2023-01-28 NOTE — Significant Event (Signed)
Pt ate pie this morning. Would not be able to proceed to OR until 4PM. Due to this delay, discussed cancellation of procedure with patient. She is okay with this as she will be able to go to [facility] with her granddaughter now. Rx sent for megace 40mg  BID to aid in management of bleeding in the meantime. Will have clinic call when we have determined a new OR date to reschedule. Patient in agreement with the plan.

## 2023-01-29 NOTE — Telephone Encounter (Signed)
2nd attempt to reach patient in regards to new surgery date and advice from provider. Left voicemail requesting call back.

## 2023-01-29 NOTE — Telephone Encounter (Signed)
-----   Message from Doylene Bode sent at 01/28/2023  4:16 PM EDT ----- This patient ate this am. Her surgery has been rescheduled to November 26. Please let her know and she needs to keep taking megace during this time. She will need a new post-op made.   Please advise her that megace is a hormone and she needs to monitor for symptoms of blood clots esp if she is traveling to [place] (she should move around freq).

## 2023-01-30 NOTE — Telephone Encounter (Signed)
Spoke with Ms. Mustapha and relayed  message from providers that her surgery has been rescheduled to November 26. Advised pt to keep taking her megace during this time, it's a hormone and pt needs to monitor for symptoms of blood clots especially while traveling to [place], make sure to move around frequently. Pt was also given a post op appt. For Monday, December 9 th with Dr. Alvester Morin. Pt verbalized understanding and had no concerns or questions at this time.

## 2023-02-03 ENCOUNTER — Encounter: Payer: 59 | Admitting: Psychiatry

## 2023-02-10 ENCOUNTER — Encounter: Payer: 59 | Admitting: Psychiatry

## 2023-02-17 ENCOUNTER — Encounter: Payer: 59 | Admitting: Psychiatry

## 2023-02-26 NOTE — Progress Notes (Addendum)
Anesthesia Review:  PCP: [facility]  Cardiologist : none  Chest x-ray : 01/26/23- 2 view  EKG : 01/07/23  Echo : Stress test: Cardiac Cath :  Activity level: can do a flight of stairs without difficutly  Sleep Study/ CPAP : none  Fasting Blood Sugar :      / Checks Blood Sugar -- times a day:   Blood Thinner/ Instructions /Last Dose: ASA / Instructions/ Last Dose :   Surgery cancelled on 01/28/23 due to pt ate am of surgery.

## 2023-02-27 NOTE — Progress Notes (Signed)
Sent message, via epic in basket, requesting orders in epic from surgeon.  

## 2023-02-28 ENCOUNTER — Telehealth: Payer: Self-pay | Admitting: *Deleted

## 2023-02-28 NOTE — Telephone Encounter (Signed)
Attempted to reach Beth Cole. Left voicemail requesting call back.

## 2023-02-28 NOTE — Telephone Encounter (Signed)
Spoke with Ms. Beth Cole in regards to patient declining blood transfusion during her surgery. Pt states in a life-threatening situation I agree to a blood transfusion. I had patient repeat after me, "yes" I [name] agree to a blood transfusion if absolutely necessary. Pt is scheduled for pre-admission testing on Monday, 11/18 at [facility] and Surgery is scheduled for 11/26.

## 2023-03-03 ENCOUNTER — Encounter (HOSPITAL_COMMUNITY)
Admission: RE | Admit: 2023-03-03 | Discharge: 2023-03-03 | Disposition: A | Discharge status: Home / Self Care | Payer: MEDICAID | Source: Ambulatory Visit | Attending: Psychiatry | Admitting: Psychiatry

## 2023-03-03 ENCOUNTER — Encounter (HOSPITAL_COMMUNITY): Payer: Self-pay

## 2023-03-03 ENCOUNTER — Other Ambulatory Visit: Payer: Self-pay

## 2023-03-03 VITALS — BP 150/96 | HR 109 | Temp 98.3°F | Resp 16 | Ht 62.0 in | Wt 147.0 lb

## 2023-03-03 DIAGNOSIS — Z01818 Encounter for other preprocedural examination: Secondary | ICD-10-CM

## 2023-03-03 DIAGNOSIS — C541 Malignant neoplasm of endometrium: Secondary | ICD-10-CM | POA: Diagnosis not present

## 2023-03-03 DIAGNOSIS — Z01812 Encounter for preprocedural laboratory examination: Secondary | ICD-10-CM | POA: Diagnosis present

## 2023-03-03 LAB — BASIC METABOLIC PANEL
Anion gap: 10 (ref 5–15)
BUN: 16 mg/dL (ref 6–20)
CO2: 24 mmol/L (ref 22–32)
Calcium: 9.7 mg/dL (ref 8.9–10.3)
Chloride: 106 mmol/L (ref 98–111)
Creatinine, Ser: 0.75 mg/dL (ref 0.44–1.00)
GFR, Estimated: >60 mL/min (ref >60)
Glucose, Bld: 88 mg/dL (ref 70–99)
Potassium: 3.5 mmol/L (ref 3.5–5.1)
Sodium: 140 mmol/L (ref 135–145)

## 2023-03-03 LAB — CBC
HCT: 39.2 % (ref 36.0–46.0)
Hemoglobin: 12.4 g/dL (ref 12.0–15.0)
MCH: 26.6 pg (ref 26.0–34.0)
MCHC: 31.6 g/dL (ref 30.0–36.0)
MCV: 83.9 fL (ref 80.0–100.0)
Platelets: 351 10*3/uL (ref 150–400)
RBC: 4.67 MIL/uL (ref 3.87–5.11)
RDW: 14 % (ref 11.5–15.5)
WBC: 9 10*3/uL (ref 4.0–10.5)
nRBC: 0 % (ref 0.0–0.2)

## 2023-03-03 NOTE — Patient Instructions (Signed)
SURGICAL WAITING ROOM VISITATION  Patients having surgery or a procedure may have no more than 2 support people in the waiting area - these visitors may rotate.    Children under the age of 7 must have an adult with them who is not the patient.  Due to an increase in RSV and influenza rates and associated hospitalizations, children ages 83 and under may not visit patients in [facility] hospitals.  If the patient needs to stay at the hospital during part of their recovery, the visitor guidelines for inpatient rooms apply. Pre-op nurse will coordinate an appropriate time for 1 support person to accompany patient in pre-op.  This support person may not rotate.    Please refer to the [facility] website for the visitor guidelines for Inpatients (after your surgery is over and you are in a regular room).       Your procedure is scheduled on:  03/11/23    Report to [facility] Main Entrance    Report to admitting at   [phone]   Call this number if you have problems the morning of surgery [phone]   Do not eat food :After Midnight. EAT a light diet the day before surgery.  Avoid gas producing foods.     After Midnight you may have the following liquids until _ 0430_____ AM/ PM DAY OF SURGERY  Water Non-Citrus Juices (without pulp, NO RED-Apple, White grape, White cranberry) Black Coffee (NO MILK/CREAM OR CREAMERS, sugar ok)  Clear Tea (NO MILK/CREAM OR CREAMERS, sugar ok) regular and decaf                             Plain Jell-O (NO RED)                                           Fruit ices (not with fruit pulp, NO RED)                                     Popsicles (NO RED)                                                               Sports drinks like Gatorade (NO RED)                            If you have questions, please contact your surgeon's office.      Oral Hygiene is also important to reduce your risk of infection.                                     Remember - BRUSH YOUR TEETH THE MORNING OF SURGERY WITH YOUR REGULAR TOOTHPASTE  DENTURES WILL BE REMOVED PRIOR TO SURGERY PLEASE DO NOT APPLY "Poly grip" OR ADHESIVES!!!   Do NOT smoke after Midnight   Stop all vitamins and herbal supplements 7 days before surgery.   Take these medicines the morning of surgery  with A SIP OF WATER:  claritin If needed   DO NOT TAKE ANY ORAL DIABETIC MEDICATIONS DAY OF YOUR SURGERY  Bring CPAP mask and tubing day of surgery.                              You may not have any metal on your body including hair pins, jewelry, and body piercing             Do not wear make-up, lotions, powders, perfumes/cologne, or deodorant  Do not wear nail polish including gel and S&S, artificial/acrylic nails, or any other type of covering on natural nails including finger and toenails. If you have artificial nails, gel coating, etc. that needs to be removed by a nail salon please have this removed prior to surgery or surgery may need to be canceled/ delayed if the surgeon/ anesthesia feels like they are unable to be safely monitored.   Do not shave  48 hours prior to surgery.               Men may shave face and neck.   Do not bring valuables to the hospital. [facility] IS NOT             RESPONSIBLE   FOR VALUABLES.   Contacts, glasses, dentures or bridgework may not be worn into surgery.   Bring small overnight bag day of surgery.   DO NOT BRING YOUR HOME MEDICATIONS TO THE HOSPITAL. PHARMACY WILL DISPENSE MEDICATIONS LISTED ON YOUR MEDICATION LIST TO YOU DURING YOUR ADMISSION IN THE HOSPITAL!    Patients discharged on the day of surgery will not be allowed to drive home.  Someone NEEDS to stay with you for the first 24 hours after anesthesia.   Special Instructions: Bring a copy of your healthcare power of attorney and living will documents the day of surgery if you haven't scanned them before.              Please read over the following fact sheets you  were given: IF YOU HAVE QUESTIONS ABOUT YOUR PRE-OP INSTRUCTIONS PLEASE CALL [phone]   If you received a COVID test during your pre-op visit  it is requested that you wear a mask when out in public, stay away from anyone that may not be feeling well and notify your surgeon if you develop symptoms. If you test positive for Covid or have been in contact with anyone that has tested positive in the last 10 days please notify you surgeon.    [facility] - Preparing for Surgery Before surgery, you can play an important role.  Because skin is not sterile, your skin needs to be as free of germs as possible.  You can reduce the number of germs on your skin by washing with CHG (chlorahexidine gluconate) soap before surgery.  CHG is an antiseptic cleaner which kills germs and bonds with the skin to continue killing germs even after washing. Please DO NOT use if you have an allergy to CHG or antibacterial soaps.  If your skin becomes reddened/irritated stop using the CHG and inform your nurse when you arrive at Short Stay. Do not shave (including legs and underarms) for at least 48 hours prior to the first CHG shower.  You may shave your face/neck. Please follow these instructions carefully:  1.  Shower with CHG Soap the night before surgery and the  morning of Surgery.  2.  If you  choose to wash your hair, wash your hair first as usual with your  normal  shampoo.  3.  After you shampoo, rinse your hair and body thoroughly to remove the  shampoo.                           4.  Use CHG as you would any other liquid soap.  You can apply chg directly  to the skin and wash                       Gently with a scrungie or clean washcloth.  5.  Apply the CHG Soap to your body ONLY FROM THE NECK DOWN.   Do not use on face/ open                           Wound or open sores. Avoid contact with eyes, ears mouth and genitals (private parts).                       Wash face,  Genitals (private parts) with your normal  soap.             6.  Wash thoroughly, paying special attention to the area where your surgery  will be performed.  7.  Thoroughly rinse your body with warm water from the neck down.  8.  DO NOT shower/wash with your normal soap after using and rinsing off  the CHG Soap.                9.  Pat yourself dry with a clean towel.            10.  Wear clean pajamas.            11.  Place clean sheets on your bed the night of your first shower and do not  sleep with pets. Day of Surgery : Do not apply any lotions/deodorants the morning of surgery.  Please wear clean clothes to the hospital/surgery center.  FAILURE TO FOLLOW THESE INSTRUCTIONS MAY RESULT IN THE CANCELLATION OF YOUR SURGERY PATIENT SIGNATURE_________________________________  NURSE SIGNATURE__________________________________  ________________________________________________________________________

## 2023-03-05 ENCOUNTER — Encounter (HOSPITAL_COMMUNITY): Payer: Self-pay

## 2023-03-10 ENCOUNTER — Telehealth: Payer: Self-pay | Admitting: *Deleted

## 2023-03-10 ENCOUNTER — Encounter (HOSPITAL_COMMUNITY): Payer: Self-pay | Admitting: Psychiatry

## 2023-03-10 NOTE — Anesthesia Preprocedure Evaluation (Signed)
Anesthesia Evaluation  Patient identified by MRN, date of birth, ID band Patient awake    Reviewed: Allergy & Precautions, NPO status , Patient's Chart, lab work & pertinent test results, reviewed documented beta blocker date and time   Airway Mallampati: II  TM Distance: >3 FB     Dental  (+) Dental Advisory Given   Pulmonary Current Smoker and Patient abstained from smoking.   Pulmonary exam normal breath sounds clear to auscultation       Cardiovascular hypertension, Pt. on medications Normal cardiovascular exam Rhythm:Regular Rate:Normal  EKG 01/07/23 ST 116/min, LAD   Neuro/Psych  PSYCHIATRIC DISORDERS Anxiety Depression Bipolar Disorder   negative neurological ROS     GI/Hepatic ,GERD  Medicated,,(+)     substance abuse  alcohol use  Endo/Other   Hyperthyroidism Pre diabetes  Renal/GU negative Renal ROS Bladder dysfunction      Musculoskeletal negative musculoskeletal ROS (+)  narcotic dependentChronic LBP   Abdominal   Peds  Hematology   Anesthesia Other Findings   Reproductive/Obstetrics Endometrial Ca                              Anesthesia Physical Anesthesia Plan  ASA: 2  Anesthesia Plan: General   Post-op Pain Management: Dilaudid IV and Ofirmev IV (intra-op)*   Induction: Intravenous  PONV Risk Score and Plan: 4 or greater and Treatment may vary due to age or medical condition, Midazolam, Ondansetron and Dexamethasone  Airway Management Planned: Oral ETT  Additional Equipment: None  Intra-op Plan:   Post-operative Plan: Extubation in OR  Informed Consent: I have reviewed the patients History and Physical, chart, labs and discussed the procedure including the risks, benefits and alternatives for the proposed anesthesia with the patient or authorized representative who has indicated his/her understanding and acceptance.     Dental advisory given  Plan  Discussed with: Anesthesiologist and CRNA  Anesthesia Plan Comments:          Anesthesia Quick Evaluation

## 2023-03-10 NOTE — Telephone Encounter (Signed)
Telephone call to check on pre-operative status. Spoke with patient's daughter Toni Amend and will relay instructions to patient. Patient's daughter compliant with pre-operative instructions.  Reinforced nothing to eat after midnight. Clear liquids until 0430. Patient to arrive at 0515.  No questions or concerns voiced.  Instructed to call for any needs.

## 2023-03-11 ENCOUNTER — Ambulatory Visit (HOSPITAL_COMMUNITY): Payer: 59 | Admitting: Medical

## 2023-03-11 ENCOUNTER — Inpatient Hospital Stay (HOSPITAL_COMMUNITY)
Admission: AD | Admit: 2023-03-11 | Discharge: 2023-03-13 | DRG: 740 | Disposition: A | Discharge status: Home / Self Care | Payer: 59 | Source: Ambulatory Visit | Attending: Psychiatry | Admitting: Psychiatry

## 2023-03-11 ENCOUNTER — Other Ambulatory Visit: Payer: Self-pay

## 2023-03-11 ENCOUNTER — Ambulatory Visit (HOSPITAL_BASED_OUTPATIENT_CLINIC_OR_DEPARTMENT_OTHER): Payer: Self-pay | Admitting: Anesthesiology

## 2023-03-11 ENCOUNTER — Encounter (HOSPITAL_COMMUNITY)
Admission: AD | Disposition: A | Discharge status: Home / Self Care | Payer: Self-pay | Source: Ambulatory Visit | Attending: Psychiatry

## 2023-03-11 ENCOUNTER — Encounter (HOSPITAL_COMMUNITY): Payer: Self-pay | Admitting: Psychiatry

## 2023-03-11 DIAGNOSIS — C541 Malignant neoplasm of endometrium: Principal | ICD-10-CM

## 2023-03-11 DIAGNOSIS — D62 Acute posthemorrhagic anemia: Secondary | ICD-10-CM | POA: Diagnosis not present

## 2023-03-11 DIAGNOSIS — F17200 Nicotine dependence, unspecified, uncomplicated: Secondary | ICD-10-CM | POA: Diagnosis present

## 2023-03-11 DIAGNOSIS — Z79899 Other long term (current) drug therapy: Secondary | ICD-10-CM

## 2023-03-11 DIAGNOSIS — E0590 Thyrotoxicosis, unspecified without thyrotoxic crisis or storm: Secondary | ICD-10-CM | POA: Diagnosis present

## 2023-03-11 DIAGNOSIS — N8003 Adenomyosis of the uterus: Secondary | ICD-10-CM | POA: Diagnosis not present

## 2023-03-11 DIAGNOSIS — R710 Precipitous drop in hematocrit: Secondary | ICD-10-CM

## 2023-03-11 DIAGNOSIS — R7303 Prediabetes: Secondary | ICD-10-CM | POA: Diagnosis present

## 2023-03-11 DIAGNOSIS — R000 Tachycardia, unspecified: Secondary | ICD-10-CM

## 2023-03-11 DIAGNOSIS — D250 Submucous leiomyoma of uterus: Secondary | ICD-10-CM | POA: Diagnosis not present

## 2023-03-11 DIAGNOSIS — D251 Intramural leiomyoma of uterus: Secondary | ICD-10-CM | POA: Diagnosis not present

## 2023-03-11 DIAGNOSIS — K219 Gastro-esophageal reflux disease without esophagitis: Secondary | ICD-10-CM | POA: Diagnosis present

## 2023-03-11 DIAGNOSIS — Z01818 Encounter for other preprocedural examination: Secondary | ICD-10-CM

## 2023-03-11 DIAGNOSIS — N838 Other noninflammatory disorders of ovary, fallopian tube and broad ligament: Secondary | ICD-10-CM | POA: Diagnosis not present

## 2023-03-11 HISTORY — PX: SENTINEL NODE BIOPSY: SHX6608

## 2023-03-11 HISTORY — PX: ROBOTIC ASSISTED TOTAL HYSTERECTOMY WITH BILATERAL SALPINGO OOPHERECTOMY: SHX6086

## 2023-03-11 LAB — TYPE AND SCREEN
ABO/RH(D): O POS
Antibody Screen: NEGATIVE

## 2023-03-11 SURGERY — HYSTERECTOMY, TOTAL, ROBOT-ASSISTED, LAPAROSCOPIC, WITH BILATERAL SALPINGO-OOPHORECTOMY
Anesthesia: General

## 2023-03-11 MED ORDER — STERILE WATER FOR INJECTION IJ SOLN
INTRAMUSCULAR | Status: DC | PRN
  Administered 2023-03-11: 4 mL via INTRAMUSCULAR

## 2023-03-11 MED ORDER — DEXAMETHASONE SODIUM PHOSPHATE 4 MG/ML IJ SOLN: 4.0000 mg | INTRAMUSCULAR | Status: DC

## 2023-03-11 MED ORDER — HEPARIN SODIUM (PORCINE) 5000 UNIT/ML IJ SOLN
5000.0000 [IU] | INTRAMUSCULAR | Status: AC
  Administered 2023-03-11: 5000 [IU] via SUBCUTANEOUS
  Filled 2023-03-11: qty 1

## 2023-03-11 MED ORDER — FENTANYL CITRATE (PF) 100 MCG/2ML IJ SOLN
INTRAMUSCULAR | Status: DC | PRN
  Administered 2023-03-11: 50 ug via INTRAVENOUS
  Administered 2023-03-11: 150 ug via INTRAVENOUS

## 2023-03-11 MED ORDER — LIDOCAINE HCL (CARDIAC) PF 100 MG/5ML IV SOSY
PREFILLED_SYRINGE | INTRAVENOUS | Status: DC | PRN
  Administered 2023-03-11: 80 mg via INTRAVENOUS

## 2023-03-11 MED ORDER — METRONIDAZOLE 500 MG/100ML IV SOLN
INTRAVENOUS | Status: AC
  Filled 2023-03-11: qty 100

## 2023-03-11 MED ORDER — DEXAMETHASONE SODIUM PHOSPHATE 10 MG/ML IJ SOLN
INTRAMUSCULAR | Status: DC | PRN
  Administered 2023-03-11: 5 mg via INTRAVENOUS

## 2023-03-11 MED ORDER — LIDOCAINE HCL (PF) 2 % IJ SOLN
INTRAMUSCULAR | Status: AC
  Filled 2023-03-11: qty 5

## 2023-03-11 MED ORDER — ROCURONIUM BROMIDE 10 MG/ML (PF) SYRINGE
PREFILLED_SYRINGE | INTRAVENOUS | Status: AC
  Filled 2023-03-11: qty 10

## 2023-03-11 MED ORDER — OXYCODONE HCL 5 MG PO TABS
5.0000 mg | ORAL_TABLET | ORAL | Status: DC | PRN
Start: 2023-03-11 — End: 2023-03-13
  Administered 2023-03-11 – 2023-03-13 (×9): 5 mg via ORAL
  Filled 2023-03-11 (×9): qty 1

## 2023-03-11 MED ORDER — MIDAZOLAM HCL 2 MG/2ML IJ SOLN
INTRAMUSCULAR | Status: AC
Start: 2023-03-11 — End: ?
  Filled 2023-03-11: qty 2

## 2023-03-11 MED ORDER — MIDAZOLAM HCL 5 MG/5ML IJ SOLN
INTRAMUSCULAR | Status: DC | PRN
  Administered 2023-03-11: 2 mg via INTRAVENOUS

## 2023-03-11 MED ORDER — SUGAMMADEX SODIUM 200 MG/2ML IV SOLN
INTRAVENOUS | Status: DC | PRN
  Administered 2023-03-11: 300 mg via INTRAVENOUS

## 2023-03-11 MED ORDER — ALBUMIN HUMAN 5 % IV SOLN
INTRAVENOUS | Status: AC
  Filled 2023-03-11: qty 250

## 2023-03-11 MED ORDER — ONDANSETRON HCL 4 MG/2ML IJ SOLN
INTRAMUSCULAR | Status: AC
  Filled 2023-03-11: qty 2

## 2023-03-11 MED ORDER — ALBUMIN HUMAN 5 % IV SOLN: INTRAVENOUS | Status: DC | PRN

## 2023-03-11 MED ORDER — PROPOFOL 10 MG/ML IV BOLUS
INTRAVENOUS | Status: AC
  Filled 2023-03-11: qty 20

## 2023-03-11 MED ORDER — ACETAMINOPHEN 10 MG/ML IV SOLN
INTRAVENOUS | Status: AC
  Filled 2023-03-11: qty 100

## 2023-03-11 MED ORDER — OXYCODONE HCL 5 MG/5ML PO SOLN: 5.0000 mg | Freq: Once | ORAL | Status: AC | PRN

## 2023-03-11 MED ORDER — HYDROMORPHONE HCL 1 MG/ML IJ SOLN
INTRAMUSCULAR | Status: DC | PRN
Start: 2023-03-11 — End: 2023-03-11
  Administered 2023-03-11: .5 mg via INTRAVENOUS

## 2023-03-11 MED ORDER — SIMETHICONE 80 MG PO CHEW
80.0000 mg | CHEWABLE_TABLET | Freq: Three times a day (TID) | ORAL | Status: DC | PRN
  Administered 2023-03-11 – 2023-03-12 (×2): 80 mg via ORAL
  Filled 2023-03-11 (×4): qty 1

## 2023-03-11 MED ORDER — SODIUM CHLORIDE 0.9% FLUSH
10.0000 mL | Freq: Two times a day (BID) | INTRAVENOUS | Status: DC
  Administered 2023-03-11 – 2023-03-12 (×3): 10 mL via INTRAVENOUS

## 2023-03-11 MED ORDER — DEXAMETHASONE SODIUM PHOSPHATE 10 MG/ML IJ SOLN
INTRAMUSCULAR | Status: AC
  Filled 2023-03-11: qty 1

## 2023-03-11 MED ORDER — PHENYLEPHRINE HCL-NACL 20-0.9 MG/250ML-% IV SOLN
INTRAVENOUS | Status: AC
  Filled 2023-03-11: qty 500

## 2023-03-11 MED ORDER — ACETAMINOPHEN 500 MG PO TABS
1000.0000 mg | ORAL_TABLET | Freq: Four times a day (QID) | ORAL | Status: DC
  Administered 2023-03-11 – 2023-03-13 (×6): 1000 mg via ORAL
  Filled 2023-03-11 (×6): qty 2

## 2023-03-11 MED ORDER — FENTANYL CITRATE (PF) 250 MCG/5ML IJ SOLN
INTRAMUSCULAR | Status: AC
  Filled 2023-03-11: qty 5

## 2023-03-11 MED ORDER — HYDROMORPHONE HCL 2 MG/ML IJ SOLN
INTRAMUSCULAR | Status: AC
  Filled 2023-03-11: qty 1

## 2023-03-11 MED ORDER — CEFAZOLIN SODIUM-DEXTROSE 2-4 GM/100ML-% IV SOLN
2.0000 g | INTRAVENOUS | Status: AC
  Administered 2023-03-11: 2 g via INTRAVENOUS
  Filled 2023-03-11: qty 100

## 2023-03-11 MED ORDER — CHLORHEXIDINE GLUCONATE 0.12 % MT SOLN
15.0000 mL | Freq: Once | OROMUCOSAL | Status: AC
  Administered 2023-03-11: 15 mL via OROMUCOSAL

## 2023-03-11 MED ORDER — BUPIVACAINE HCL 0.25 % IJ SOLN
INTRAMUSCULAR | Status: AC
  Filled 2023-03-11: qty 1

## 2023-03-11 MED ORDER — PHENYLEPHRINE 80 MCG/ML (10ML) SYRINGE FOR IV PUSH (FOR BLOOD PRESSURE SUPPORT)
PREFILLED_SYRINGE | INTRAVENOUS | Status: AC
  Filled 2023-03-11: qty 10

## 2023-03-11 MED ORDER — STERILE WATER FOR INJECTION IJ SOLN
INTRAMUSCULAR | Status: AC
  Filled 2023-03-11: qty 10

## 2023-03-11 MED ORDER — PHENYLEPHRINE HCL-NACL 20-0.9 MG/250ML-% IV SOLN
INTRAVENOUS | Status: DC | PRN
  Administered 2023-03-11: 40 ug/min via INTRAVENOUS

## 2023-03-11 MED ORDER — METHIMAZOLE 5 MG PO TABS
5.0000 mg | ORAL_TABLET | Freq: Every day | ORAL | Status: DC
  Administered 2023-03-12 – 2023-03-13 (×2): 5 mg via ORAL
  Filled 2023-03-11 (×2): qty 1

## 2023-03-11 MED ORDER — HYDROMORPHONE HCL 1 MG/ML IJ SOLN
0.2500 mg | INTRAMUSCULAR | Status: DC | PRN
  Administered 2023-03-11 (×3): 0.5 mg via INTRAVENOUS

## 2023-03-11 MED ORDER — ONDANSETRON HCL 4 MG/2ML IJ SOLN
INTRAMUSCULAR | Status: DC | PRN
  Administered 2023-03-11: 4 mg via INTRAVENOUS

## 2023-03-11 MED ORDER — DICLOFENAC SODIUM 25 MG PO TBEC
50.0000 mg | DELAYED_RELEASE_TABLET | Freq: Three times a day (TID) | ORAL | Status: DC
  Administered 2023-03-11 – 2023-03-12 (×3): 50 mg via ORAL
  Filled 2023-03-11 (×5): qty 2

## 2023-03-11 MED ORDER — PROPOFOL 10 MG/ML IV BOLUS
INTRAVENOUS | Status: DC | PRN
  Administered 2023-03-11: 150 mg via INTRAVENOUS

## 2023-03-11 MED ORDER — PHENYLEPHRINE HCL (PRESSORS) 10 MG/ML IV SOLN
INTRAVENOUS | Status: DC | PRN
  Administered 2023-03-11 (×4): 160 ug via INTRAVENOUS
  Administered 2023-03-11 (×2): 80 ug via INTRAVENOUS

## 2023-03-11 MED ORDER — OXYCODONE HCL 5 MG PO TABS
5.0000 mg | ORAL_TABLET | Freq: Once | ORAL | Status: AC | PRN
  Administered 2023-03-11: 5 mg via ORAL

## 2023-03-11 MED ORDER — ONDANSETRON HCL 4 MG/2ML IJ SOLN: 4.0000 mg | Freq: Once | INTRAMUSCULAR | Status: DC | PRN

## 2023-03-11 MED ORDER — LACTATED RINGERS IV SOLN
INTRAVENOUS | Status: DC
Start: 2023-03-11 — End: 2023-03-11

## 2023-03-11 MED ORDER — HYDROMORPHONE HCL 1 MG/ML IJ SOLN
INTRAMUSCULAR | Status: AC
  Administered 2023-03-11: 0.5 mg via INTRAVENOUS
  Filled 2023-03-11: qty 2

## 2023-03-11 MED ORDER — ROCURONIUM BROMIDE 100 MG/10ML IV SOLN: INTRAVENOUS | Status: DC | PRN

## 2023-03-11 MED ORDER — ONDANSETRON HCL 4 MG PO TABS: 4.0000 mg | ORAL_TABLET | Freq: Four times a day (QID) | ORAL | Status: DC | PRN

## 2023-03-11 MED ORDER — ROCURONIUM BROMIDE 10 MG/ML (PF) SYRINGE
PREFILLED_SYRINGE | INTRAVENOUS | Status: DC | PRN
  Administered 2023-03-11: 80 mg via INTRAVENOUS

## 2023-03-11 MED ORDER — BUPIVACAINE HCL 0.25 % IJ SOLN
INTRAMUSCULAR | Status: DC | PRN
  Administered 2023-03-11: 27 mL

## 2023-03-11 MED ORDER — ONDANSETRON HCL 4 MG/2ML IJ SOLN
4.0000 mg | Freq: Four times a day (QID) | INTRAMUSCULAR | Status: DC | PRN
  Administered 2023-03-11: 4 mg via INTRAVENOUS
  Filled 2023-03-11 (×2): qty 2

## 2023-03-11 MED ORDER — OXYCODONE HCL 5 MG PO TABS
ORAL_TABLET | ORAL | Status: AC
  Filled 2023-03-11: qty 1

## 2023-03-11 MED ORDER — KETAMINE HCL 50 MG/5ML IJ SOSY
PREFILLED_SYRINGE | INTRAMUSCULAR | Status: AC
  Filled 2023-03-11: qty 5

## 2023-03-11 MED ORDER — ACETAMINOPHEN 500 MG PO TABS
1000.0000 mg | ORAL_TABLET | ORAL | Status: AC
  Administered 2023-03-11: 1000 mg via ORAL
  Filled 2023-03-11: qty 2

## 2023-03-11 MED ORDER — METRONIDAZOLE 500 MG/100ML IV SOLN
INTRAVENOUS | Status: DC | PRN
  Administered 2023-03-11: 500 mg via INTRAVENOUS

## 2023-03-11 MED ORDER — STERILE WATER FOR IRRIGATION IR SOLN
Status: DC | PRN
  Administered 2023-03-11: 1000 mL

## 2023-03-11 MED ORDER — AMISULPRIDE (ANTIEMETIC) 5 MG/2ML IV SOLN
INTRAVENOUS | Status: AC
  Filled 2023-03-11: qty 4

## 2023-03-11 MED ORDER — SODIUM CHLORIDE 0.9% FLUSH: 3.0000 mL | Freq: Two times a day (BID) | INTRAVENOUS | Status: DC

## 2023-03-11 MED ORDER — ORAL CARE MOUTH RINSE: 15.0000 mL | Freq: Once | OROMUCOSAL | Status: AC

## 2023-03-11 MED ORDER — HYDROMORPHONE HCL 1 MG/ML IJ SOLN
0.5000 mg | INTRAMUSCULAR | Status: DC | PRN
  Administered 2023-03-12 (×2): 0.5 mg via INTRAVENOUS
  Filled 2023-03-11 (×2): qty 0.5

## 2023-03-11 MED ORDER — AMISULPRIDE (ANTIEMETIC) 5 MG/2ML IV SOLN
10.0000 mg | Freq: Once | INTRAVENOUS | Status: AC | PRN
  Administered 2023-03-11: 10 mg via INTRAVENOUS

## 2023-03-11 MED ORDER — DEXMEDETOMIDINE HCL IN NACL 80 MCG/20ML IV SOLN
INTRAVENOUS | Status: DC | PRN
  Administered 2023-03-11 (×2): 8 ug via INTRAVENOUS

## 2023-03-11 MED ORDER — SENNOSIDES-DOCUSATE SODIUM 8.6-50 MG PO TABS
2.0000 | ORAL_TABLET | Freq: Every day | ORAL | Status: DC
  Administered 2023-03-11 – 2023-03-12 (×2): 2 via ORAL
  Filled 2023-03-11 (×2): qty 2

## 2023-03-11 MED ORDER — ENOXAPARIN SODIUM 40 MG/0.4ML IJ SOSY: 40.0000 mg | PREFILLED_SYRINGE | INTRAMUSCULAR | Status: DC

## 2023-03-11 MED ORDER — SODIUM CHLORIDE 0.9 % IV SOLN: INTRAVENOUS | Status: DC | PRN

## 2023-03-11 MED ORDER — STERILE WATER FOR INJECTION IJ SOLN
INTRAMUSCULAR | Status: DC | PRN
  Administered 2023-03-11: 1 mL via INTRAMUSCULAR

## 2023-03-11 MED ORDER — ACETAMINOPHEN 10 MG/ML IV SOLN: INTRAVENOUS | Status: DC | PRN

## 2023-03-11 SURGICAL SUPPLY — 72 items
APPLICATOR SURGIFLO ENDO (HEMOSTASIS) IMPLANT
BAG LAPAROSCOPIC 12 15 PORT 16 (BASKET) IMPLANT
BAG RETRIEVAL 12/15 (BASKET)
BLADE SURG SZ10 CARB STEEL (BLADE) IMPLANT
COVER BACK TABLE 60X90IN (DRAPES) ×3 IMPLANT
COVER TIP SHEARS 8 DVNC (MISCELLANEOUS) ×3 IMPLANT
DERMABOND ADVANCED .7 DNX12 (GAUZE/BANDAGES/DRESSINGS) ×3 IMPLANT
DRAPE ARM DVNC X/XI (DISPOSABLE) ×12 IMPLANT
DRAPE COLUMN DVNC XI (DISPOSABLE) ×3 IMPLANT
DRAPE SHEET LG 3/4 BI-LAMINATE (DRAPES) ×3 IMPLANT
DRAPE SURG IRRIG POUCH 19X23 (DRAPES) ×3 IMPLANT
DRIVER NDL MEGA SUTCUT DVNCXI (INSTRUMENTS) ×3 IMPLANT
DRIVER NDLE MEGA SUTCUT DVNCXI (INSTRUMENTS) ×2
DRSG OPSITE POSTOP 4X6 (GAUZE/BANDAGES/DRESSINGS) IMPLANT
DRSG OPSITE POSTOP 4X8 (GAUZE/BANDAGES/DRESSINGS) IMPLANT
ELECT PENCIL ROCKER SW 15FT (MISCELLANEOUS) IMPLANT
ELECT REM PT RETURN 15FT ADLT (MISCELLANEOUS) ×3 IMPLANT
FORCEPS BPLR FENES DVNC XI (FORCEP) ×3 IMPLANT
FORCEPS PROGRASP DVNC XI (FORCEP) ×3 IMPLANT
GAUZE 4X4 16PLY ~~LOC~~+RFID DBL (SPONGE) ×3 IMPLANT
GLOVE BIO SURGEON STRL SZ 6 (GLOVE) ×12 IMPLANT
GLOVE BIO SURGEON STRL SZ 6.5 (GLOVE) ×3 IMPLANT
GLOVE BIOGEL PI IND STRL 6.5 (GLOVE) ×6 IMPLANT
GOWN STRL REUS W/ TWL LRG LVL3 (GOWN DISPOSABLE) ×12 IMPLANT
GRASPER SUT TROCAR 14GX15 (MISCELLANEOUS) IMPLANT
HOLDER FOLEY CATH W/STRAP (MISCELLANEOUS) IMPLANT
IRRIG SUCT STRYKERFLOW 2 WTIP (MISCELLANEOUS) ×2
IRRIGATION SUCT STRKRFLW 2 WTP (MISCELLANEOUS) ×3 IMPLANT
KIT PROCEDURE DVNC SI (MISCELLANEOUS) IMPLANT
KIT TURNOVER KIT A (KITS) IMPLANT
LIGASURE IMPACT 36 18CM CVD LR (INSTRUMENTS) IMPLANT
MANIPULATOR ADVINCU DEL 3.0 PL (MISCELLANEOUS) IMPLANT
MANIPULATOR ADVINCU DEL 3.5 PL (MISCELLANEOUS) IMPLANT
MANIPULATOR UTERINE 4.5 ZUMI (MISCELLANEOUS) IMPLANT
NDL HYPO 21X1.5 SAFETY (NEEDLE) ×3 IMPLANT
NDL INSUFFLATION 14GA 120MM (NEEDLE) IMPLANT
NDL SPNL 20GX3.5 QUINCKE YW (NEEDLE) IMPLANT
NEEDLE HYPO 21X1.5 SAFETY (NEEDLE) ×2
NEEDLE INSUFFLATION 14GA 120MM (NEEDLE)
NEEDLE SPNL 20GX3.5 QUINCKE YW (NEEDLE) ×2
OBTURATOR OPTICAL STND 8 DVNC (TROCAR) ×2
OBTURATOR OPTICALSTD 8 DVNC (TROCAR) ×3 IMPLANT
PACK ROBOT GYN CUSTOM WL (TRAY / TRAY PROCEDURE) ×3 IMPLANT
PAD ARMBOARD 7.5X6 YLW CONV (MISCELLANEOUS) ×3 IMPLANT
PAD POSITIONING PINK XL (MISCELLANEOUS) ×3 IMPLANT
PORT ACCESS TROCAR AIRSEAL 12 (TROCAR) IMPLANT
SCISSORS MNPLR CVD DVNC XI (INSTRUMENTS) ×3 IMPLANT
SCRUB CHG 4% DYNA-HEX 4OZ (MISCELLANEOUS) ×6 IMPLANT
SEAL UNIV 5-12 XI (MISCELLANEOUS) ×12 IMPLANT
SET TRI-LUMEN FLTR TB AIRSEAL (TUBING) ×3 IMPLANT
SPIKE FLUID TRANSFER (MISCELLANEOUS) ×3 IMPLANT
SPONGE T-LAP 18X18 ~~LOC~~+RFID (SPONGE) IMPLANT
SURGIFLO W/THROMBIN 8M KIT (HEMOSTASIS) IMPLANT
SUT MNCRL AB 4-0 PS2 18 (SUTURE) IMPLANT
SUT PDS AB 1 TP1 54 (SUTURE) IMPLANT
SUT VIC AB 0 CT1 27XBRD ANTBC (SUTURE) IMPLANT
SUT VIC AB 2-0 CT1 TAPERPNT 27 (SUTURE) IMPLANT
SUT VIC AB 4-0 PS2 18 (SUTURE) ×6 IMPLANT
SUT VICRYL 0 27 CT2 27 ABS (SUTURE) ×3 IMPLANT
SUT VLOC 180 0 9IN GS21 (SUTURE) IMPLANT
SYR 10ML LL (SYRINGE) IMPLANT
SYS BAG RETRIEVAL 10MM (BASKET)
SYS WOUND ALEXIS 18CM MED (MISCELLANEOUS)
SYSTEM BAG RETRIEVAL 10MM (BASKET) IMPLANT
SYSTEM WOUND ALEXIS 18CM MED (MISCELLANEOUS) IMPLANT
TOWEL OR NON WOVEN STRL DISP B (DISPOSABLE) IMPLANT
TRAP SPECIMEN MUCUS 40CC (MISCELLANEOUS) IMPLANT
TRAY FOLEY MTR SLVR 16FR STAT (SET/KITS/TRAYS/PACK) ×3 IMPLANT
TROCAR PORT AIRSEAL 5X120 (TROCAR) IMPLANT
UNDERPAD 30X36 HEAVY ABSORB (UNDERPADS AND DIAPERS) ×6 IMPLANT
WATER STERILE IRR 1000ML POUR (IV SOLUTION) ×3 IMPLANT
YANKAUER SUCT BULB TIP 10FT TU (MISCELLANEOUS) IMPLANT

## 2023-03-11 NOTE — Discharge Instructions (Addendum)
AFTER SURGERY INSTRUCTIONS   Return to work: 4-6 weeks if applicable   Activity: 1. Be up and out of the bed during the day.  Take a nap if needed.  You may walk up steps but be careful and use the hand rail.  Stair climbing will tire you more than you think, you may need to stop part way and rest.    2. No lifting or straining for 6 weeks over 10 pounds. No pushing, pulling, straining for 6 weeks.   3. No driving for around 1 week(s).  Do not drive if you are taking narcotic pain medicine and make sure that your reaction time has returned.    4. You can shower as soon as the next day after surgery. Shower daily.  Use your regular soap and water (not directly on the incision) and pat your incision(s) dry afterwards; don't rub.  No tub baths or submerging your body in water until cleared by your surgeon. If you have the soap that was given to you by pre-surgical testing that was used before surgery, you do not need to use it afterwards because this can irritate your incisions.    5. No sexual activity and nothing in the vagina for 12 weeks.   6. You may experience a small amount of clear drainage from your incisions, which is normal.  If the drainage persists, increases, or changes color please call the office.   7. Do not use creams, lotions, or ointments such as neosporin on your incisions after surgery until advised by your surgeon because they can cause removal of the dermabond glue on your incisions.     8. You may experience vaginal spotting after surgery or when the stitches at the top of the vagina begin to dissolve.  The spotting is normal but if you experience heavy bleeding, call our office.   9. Take Tylenol or ibuprofen first for pain if you are able to take these medications and only use narcotic pain medication for severe pain not relieved by the Tylenol or Ibuprofen.  Monitor your Tylenol intake to a max of 4,000 mg in a 24 hour period. You can alternate these medications after  surgery.   Diet: 1. Low sodium Heart Healthy Diet is recommended but you are cleared to resume your normal (before surgery) diet after your procedure.   2. It is safe to use a laxative, such as Miralax or Colace, if you have difficulty moving your bowels. You have been prescribed Sennakot-S to take at bedtime every evening after surgery to keep bowel movements regular and to prevent constipation.     Wound Care: 1. Keep clean and dry.  Shower daily.   Reasons to call the Doctor: Fever - Oral temperature greater than 100.4 degrees Fahrenheit Foul-smelling vaginal discharge Difficulty urinating Nausea and vomiting Increased pain at the site of the incision that is unrelieved with pain medicine. Difficulty breathing with or without chest pain New calf pain especially if only on one side Sudden, continuing increased vaginal bleeding with or without clots.   Contacts: For questions or concerns you should contact:   Dr. Clide Cliff at [phone]   Beth Mccreedy, NP at [phone]   After Hours: call [phone] and have the GYN Oncologist paged/contacted (after 5 pm or on the weekends). You will speak with an after hours RN and let he or she know you have had surgery.   Messages sent via mychart are for non-urgent matters and are not responded to after  hours so for urgent needs, please call the after hours number.

## 2023-03-11 NOTE — Op Note (Signed)
GYNECOLOGIC ONCOLOGY OPERATIVE NOTE  Date of Service: 03/11/2023  Preoperative Diagnosis: FIGO grade 1 endometrioid endometrial cancer  Postoperative Diagnosis: Same  Procedures: Robotic-assisted total laparoscopic hysterectomy, bilateral salpingo-oophorectomy, bilateral sentinel lymph node evaluation and biopsy  Surgeon: Clide Cliff, MD  Assistants: Antionette Char, MD and (an MD assistant was necessary for tissue manipulation, management of robotic instrumentation, retraction and positioning due to the complexity of the case and hospital policies)  Anesthesia: General  Estimated Blood Loss: 20 mL    Fluids: 1500 ml, crystalloid  Urine Output: 180 ml, clear yellow  Findings: Normal upper abdominal survey with normal liver surface and diaphragm. Normal appearing small and large bowel. Normal uterus, tubes, and ovaries. No evidence of peritoneal disease, ascites, or carcinomatosis. Sentinel mapping on right to the obturator space; sentinel mapping on left to the external iliac.   Specimens:  ID Type Source Tests Collected by Time Destination  1 : Right Obturator Sentinel Lymph Node Tissue PATH Sentinel Lymph Node SURGICAL PATHOLOGY Clide Cliff, MD 03/11/2023 [phone]   2 : Left External Iliac Sentinel Lymph Node Tissue PATH Sentinel Lymph Node SURGICAL PATHOLOGY Clide Cliff, MD 03/11/2023 [phone]   3 : CERVIX, UTERUS, BILATERAL TUBES AND OVARIES Tissue PATH Soft tissue SURGICAL PATHOLOGY Clide Cliff, MD 03/11/2023 0900     Complications:  None  Indications for Procedure: Beth Cole is a 62 y.o. woman with FIGO grade 1 endometrioid endometrial cancer.  Prior to the procedure, all risks, benefits, and alternatives were discussed and informed surgical consent was signed.  Procedure: Patient was taken to the operating room where general anesthesia was achieved.  She was positioned in dorsal lithotomy and prepped and draped.  A foley catheter was  inserted into the bladder. 1 ml of dilute Indo-Cyanine dye was was injected at 1cm and 1mm deep at 3 and 9 o'clock in the cervical stroma.  The cervix was dilated and an Advincula uterine manipulator with a colpotomy ring was inserted into the uterus.  A 12 mm incision was made in the left upper quadrant near Palmer's point.  The abdomen was entered with a 5 mm OptiView trocar under direct visualization.  The abdomen was insufflated, the patient placed in steep Trendelenburg, and additional trocars were placed as follows: an 8mm trocar superior to the umbilicus, two 8 mm robotic trocars in the right abdomen, and one 8 mm robotic trocar in the left abdomen.  The left upper quadrant trocar was removed and replaced with a 12 mm airseal trocar.  All trocars were placed under direct visualization.  The bowels were moved into the upper abdomen.  The DaVinci robotic surgical system was brought to the patient's bedside and docked.  The right round ligament was transected and the retroperitoneum entered.The right ureter was identified. The paravesical and pararectal spaces were opened, and the node was found to be located in the obturator space. A sentinel lymph node dissection was performed taking care to avoid injury to the ureter, superior vesicle artery or obturator nerve. A similar procedure was performed on left with the sentinel lymph node identified at the external iliac vessels. The sentinel lymph nodes mentioned above were identified and removed through the assistant trocar.  The left ureter was again identified, and the left infundibulopelvic ligament was isolated, cauterized, and transected. The posterior peritoneum was opened to the colpotomy ring. The anterior peritoneum was opened and the bladder flap was created. The left uterine artery was skeletonized, cauterized, and transected at the level of the colpotomy ring. Additional cautery  was used in a C-shaped fashion to allow the remainder of the broad,  cardinal, and uterosacral ligaments with the uterine vessels to be transected and fall away from the colpotomy ring.  A similar procedure was performed on the contralateral side. A colpotomy was made circumferentially following the contours of the colpotomy ring.  The uterine specimen was removed through the vagina.    The vaginal cuff was closed with two running stitches of 0 Vicryl suture from each corner, tied in midline.  The pelvis was irrigated and all operative sites were found to be hemostatic.  All instruments were removed and the robot was taken from the patient's bedside. The fascia at the 12 mm incision was closed with 0 Vicryl using a PMI device. The abdomen was desufflated and all ports were removed. The skin at all incisions was closed with 4-0 Vicryl to reapproximate the subcutaneous tissue and 4-0 monocryl in a subcuticular fashion followed by surgical glue.  Patient tolerated the procedure well. Sponge, lap, and instrument counts were correct.  Patient received 2 gm of Ancef and 500mg  Metronidazole prior to skin incision for routine perioperative antibiotic prophylaxis.  She was extubated and taken to the PACU in stable condition.  Clide Cliff, MD Gynecologic Oncology

## 2023-03-11 NOTE — Anesthesia Procedure Notes (Addendum)
Procedure Name: Intubation Date/Time: 03/11/2023 7:40 AM  Performed by: Sharyn Dross, CRNAPre-anesthesia Checklist: Patient identified, Emergency Drugs available, Suction available and Patient being monitored Patient Re-evaluated:Patient Re-evaluated prior to induction Oxygen Delivery Method: Circle system utilized Preoxygenation: Pre-oxygenation with 100% oxygen Induction Type: IV induction Ventilation: Mask ventilation without difficulty Laryngoscope Size: Mac and 4 Grade View: Grade II Tube type: Oral Tube size: 7.0 mm Number of attempts: 1 Airway Equipment and Method: Stylet and Oral airway Placement Confirmation: ETT inserted through vocal cords under direct vision, positive ETCO2 and breath sounds checked- equal and bilateral Secured at: 22 cm Tube secured with: Tape Dental Injury: Teeth and Oropharynx as per pre-operative assessment

## 2023-03-11 NOTE — Anesthesia Postprocedure Evaluation (Signed)
Anesthesia Post Note  Patient: Beth Cole  Procedure(s) Performed: XI ROBOTIC ASSISTED TOTAL HYSTERECTOMY WITH BILATERAL SALPINGO OOPHORECTOMY (Bilateral) SENTINEL NODE BIOPSY     Patient location during evaluation: PACU Anesthesia Type: General Level of consciousness: awake and alert and oriented Pain management: pain level controlled Vital Signs Assessment: post-procedure vital signs reviewed and stable Respiratory status: spontaneous breathing, nonlabored ventilation and respiratory function stable Cardiovascular status: blood pressure returned to baseline and stable Postop Assessment: no apparent nausea or vomiting Anesthetic complications: no   There were no known notable events for this encounter.  Last Vitals:  Vitals:   03/11/23 1245 03/11/23 1257  BP: 120/82 120/82  Pulse: 83 83  Resp: 14 14  Temp: 36.8 C 36.8 C  SpO2: 95%     Last Pain:  Vitals:   03/11/23 1257  TempSrc: Oral  PainSc:                  Javiel Canepa A.

## 2023-03-11 NOTE — Transfer of Care (Signed)
Immediate Anesthesia Transfer of Care Note  Patient: Beth Cole  Procedure(s) Performed: XI ROBOTIC ASSISTED TOTAL HYSTERECTOMY WITH BILATERAL SALPINGO OOPHORECTOMY (Bilateral) SENTINEL NODE BIOPSY  Patient Location: PACU  Anesthesia Type:General  Level of Consciousness: alert , oriented, drowsy, and patient cooperative  Airway & Oxygen Therapy: Patient connected to face mask oxygen  Post-op Assessment: Report given to RN, Post -op Vital signs reviewed and stable, and Patient moving all extremities X 4  Post vital signs: Reviewed and stable  Last Vitals:  Vitals Value Taken Time  BP 136/85 03/11/23 0947  Temp    Pulse 103 03/11/23 0950  Resp 20 03/11/23 0950  SpO2 98 % 03/11/23 0950  Vitals shown include unfiled device data.  Last Pain:  Vitals:   03/11/23 0551  TempSrc:   PainSc: 5          Complications: There were no known notable events for this encounter.

## 2023-03-11 NOTE — H&P (Signed)
Brief Pre-operative History & Physical  Patient name: Beth Cole CSN: [id number] MRN: [id number] Admit Date: 03/11/2023 Date of Surgery: 03/11/2023 Performing Service: Gynecology   Code Status: Not on file    Assessment & Plan    Beth Cole is a 62 y.o. female with ENDOMETRIAL CANCER, who presents for: Procedure(s) (LRB): XI ROBOTIC ASSISTED TOTAL HYSTERECTOMY WITH BILATERAL SALPINGO OOPHORECTOMY (Bilateral) SENTINEL NODE BIOPSY (N/A).   Consent obtained in office is accurate. Risks, benefits, and alternatives to surgery were reviewed, and all questions were answered.  Proceed to the OR as planned.     History of Present Illness:  Beth Cole is a 62 y.o. female with ENDOMETRIAL CANCER. She was recently seen in clinic, where a detailed HPI can be found. She was noted to benefit from: Procedure(s) (LRB): XI ROBOTIC ASSISTED TOTAL HYSTERECTOMY WITH BILATERAL SALPINGO OOPHORECTOMY (Bilateral) SENTINEL NODE BIOPSY (N/A).    Allergies Patient has no known allergies.  Medications   Current Facility-Administered Medications  Medication Dose Route Frequency Provider Last Rate Last Admin   ceFAZolin (ANCEF) IVPB 2g/100 mL premix  2 g Intravenous On Call to OR Cross, Melissa D, NP       dexamethasone (DECADRON) injection 4 mg  4 mg Intravenous On Call to OR Cross, Laurene Footman, NP       lactated ringers infusion   Intravenous Continuous Lewie Loron, MD 10 mL/hr at 03/11/23 0600 New Bag at 03/11/23 0600    Vital Signs BP 138/85 (BP Location: Right Arm)   Pulse (!) 104   Temp 99 F (37.2 C) (Oral)   Resp 15   Wt 147 lb (66.7 kg)   LMP 06/14/2014 (Approximate)   SpO2 98%   BMI 26.89 kg/m  Facility age limit for growth %iles is 20 years. Facility age limit for growth %iles is 20 years..   Physical Exam General: Well developed, appears stated age, in no acute distress  Mental status: Alert and oriented x3 Cardiovascular:  Normal Pulmonary: Symmetric chest rise, unlabored breathing Relevant System for Surgery: Surgical site examination deferred to the OR   Labs and Studies: Lab Results  Component Value Date   WBC 9.0 03/03/2023   HGB 12.4 03/03/2023   HCT 39.2 03/03/2023   PLT 351 03/03/2023    No results found for: "INR", "APTT" \

## 2023-03-12 ENCOUNTER — Encounter (HOSPITAL_COMMUNITY): Payer: Self-pay | Admitting: Psychiatry

## 2023-03-12 DIAGNOSIS — Z79899 Other long term (current) drug therapy: Secondary | ICD-10-CM | POA: Diagnosis not present

## 2023-03-12 DIAGNOSIS — E0590 Thyrotoxicosis, unspecified without thyrotoxic crisis or storm: Secondary | ICD-10-CM | POA: Diagnosis not present

## 2023-03-12 DIAGNOSIS — F17200 Nicotine dependence, unspecified, uncomplicated: Secondary | ICD-10-CM | POA: Diagnosis not present

## 2023-03-12 DIAGNOSIS — D62 Acute posthemorrhagic anemia: Secondary | ICD-10-CM | POA: Diagnosis not present

## 2023-03-12 DIAGNOSIS — R7303 Prediabetes: Secondary | ICD-10-CM | POA: Diagnosis not present

## 2023-03-12 DIAGNOSIS — C541 Malignant neoplasm of endometrium: Secondary | ICD-10-CM | POA: Diagnosis not present

## 2023-03-12 DIAGNOSIS — K219 Gastro-esophageal reflux disease without esophagitis: Secondary | ICD-10-CM | POA: Diagnosis not present

## 2023-03-12 LAB — CBC
HCT: 30.3 % — ABNORMAL LOW (ref 36.0–46.0)
HCT: 29.7 % — ABNORMAL LOW (ref 36.0–46.0)
Hemoglobin: 9.8 g/dL — ABNORMAL LOW (ref 12.0–15.0)
Hemoglobin: 9.5 g/dL — ABNORMAL LOW (ref 12.0–15.0)
MCH: 26.9 pg (ref 26.0–34.0)
MCH: 26.5 pg (ref 26.0–34.0)
MCHC: 32.3 g/dL (ref 30.0–36.0)
MCHC: 32 g/dL (ref 30.0–36.0)
MCV: 83.2 fL (ref 80.0–100.0)
MCV: 82.7 fL (ref 80.0–100.0)
Platelets: 306 10*3/uL (ref 150–400)
Platelets: 275 10*3/uL (ref 150–400)
RBC: 3.64 MIL/uL — ABNORMAL LOW (ref 3.87–5.11)
RBC: 3.59 MIL/uL — ABNORMAL LOW (ref 3.87–5.11)
RDW: 14.2 % (ref 11.5–15.5)
RDW: 14.3 % (ref 11.5–15.5)
WBC: 12.9 10*3/uL — ABNORMAL HIGH (ref 4.0–10.5)
WBC: 11.9 10*3/uL — ABNORMAL HIGH (ref 4.0–10.5)
nRBC: 0 % (ref 0.0–0.2)
nRBC: 0 % (ref 0.0–0.2)

## 2023-03-12 LAB — BASIC METABOLIC PANEL
Anion gap: 9 (ref 5–15)
BUN: 17 mg/dL (ref 6–20)
CO2: 23 mmol/L (ref 22–32)
Calcium: 9.1 mg/dL (ref 8.9–10.3)
Chloride: 107 mmol/L (ref 98–111)
Creatinine, Ser: 0.92 mg/dL (ref 0.44–1.00)
GFR, Estimated: >60 mL/min (ref >60)
Glucose, Bld: 100 mg/dL — ABNORMAL HIGH (ref 70–99)
Potassium: 3.5 mmol/L (ref 3.5–5.1)
Sodium: 139 mmol/L (ref 135–145)

## 2023-03-12 MED ORDER — SODIUM CHLORIDE 0.9 % IV BOLUS
500.0000 mL | Freq: Once | INTRAVENOUS | Status: AC
  Administered 2023-03-12: 500 mL via INTRAVENOUS

## 2023-03-12 MED ORDER — OXYCODONE HCL 5 MG PO TABS: 5.0000 mg | ORAL_TABLET | ORAL | 0 refills | Status: DC | PRN

## 2023-03-12 MED ORDER — DEXTROSE-SODIUM CHLORIDE 5-0.45 % IV SOLN: INTRAVENOUS | Status: DC

## 2023-03-12 MED ORDER — NICOTINE 14 MG/24HR TD PT24
14.0000 mg | MEDICATED_PATCH | Freq: Every day | TRANSDERMAL | Status: DC
  Administered 2023-03-12: 14 mg via TRANSDERMAL
  Filled 2023-03-12 (×2): qty 1

## 2023-03-12 MED ORDER — ALUM & MAG HYDROXIDE-SIMETH 200-200-20 MG/5ML PO SUSP
30.0000 mL | Freq: Four times a day (QID) | ORAL | Status: DC | PRN
  Administered 2023-03-12: 30 mL via ORAL
  Filled 2023-03-12 (×2): qty 30

## 2023-03-12 NOTE — TOC CM/SW Note (Signed)
Transition of Care [facility]) - Inpatient Brief Assessment   Patient Details  Name: Beth Cole MRN: [id number] Date of Birth: [birth year]  Transition of [facility] CM/SW Contact:    Darleene Cleaver, LCSW Phone Number: 03/12/2023, 4:34 PM   Clinical Narrative:  Patient has a PCP, and she also has insurance.  Patient does not have any SDOH needs.  No anticipated TOC needs.   Transition of Care Asessment: Insurance and Status: Insurance coverage has been reviewed Patient has primary care physician: Yes Home environment has been reviewed: Yes Prior level of function:: Indep Prior/Current Home Services: No current home services Social Determinants of Health Reivew: SDOH reviewed no interventions necessary Readmission risk has been reviewed: Yes Transition of care needs: no transition of care needs at this time

## 2023-03-12 NOTE — Progress Notes (Signed)
GYN Oncology Progress Note  Patient resting in bed in no acute distress. Reports mild lightheadedness when getting out of bed. No vaginal bleeding reported. Given current CBC values with drop in hemoglobin along with tachycardia, plan for NS bolus now with initiation of IVF. Repeat am labs ordered. Discharge on hold at this time with plans for possible discharge in the am. Lovenox on hold given drop in hemoglobin. Continue with current plan of care.

## 2023-03-12 NOTE — Progress Notes (Signed)
1 Day Post-Op Procedure(s) (LRB): XI ROBOTIC ASSISTED TOTAL HYSTERECTOMY WITH BILATERAL SALPINGO OOPHORECTOMY (Bilateral) SENTINEL NODE BIOPSY (N/A)  Subjective: Patient reports having moderate abdominal soreness this am, esp with movement and coughing.  She is tolerating her diet with no nausea or emesis.  States she is voiding without difficulty.  Feels steady when out of bed.  She is passing flatus but states she has not had a bowel movement in 3 days.  States the pain medicine does work to help control her pain and that she will need to have something at home.  She states she was told she was going to have 1 incision above her bellybutton and now she has 5. Denies chest pain, dyspnea.   Objective: Vital signs in last 24 hours: Temp:  [97.7 F (36.5 C)-99.6 F (37.6 C)] 98.6 F (37 C) (11/27 0537) Pulse Rate:  [78-118] 108 (11/27 0537) Resp:  [12-21] 16 (11/27 0537) BP: (110-156)/(75-89) 144/81 (11/27 0537) SpO2:  [92 %-100 %] 96 % (11/27 0537) Weight:  [147 lb 4.3 oz (66.8 kg)] 147 lb 4.3 oz (66.8 kg) (11/26 1257) Last BM Date : 03/10/23  Intake/Output from previous day: 11/26 0701 - 11/27 0700 In: 2930 [P.O.:960; I.V.:1520; IV Piggyback:450] Out: 1200 [Urine:1180; Blood:20]  Physical Examination: General: alert, cooperative, and no distress Resp: clear to auscultation bilaterally Cardio: regular rate and rhythm, S1, S2 normal, no murmur, click, rub or gallop GI: incision: lap sites to the abdomen with dermabond intact with no active drainage and abdomen is soft, mildly distended, appropriately tender, active bowel sounds Extremities: extremities normal, atraumatic, no cyanosis or edema  Labs: WBC/Hgb/Hct/Plts:  12.9/9.8/30.3/306 (11/27 0457) BUN/Cr/glu/ALT/AST/amyl/lip:  17/0.92/--/--/--/--/-- (11/27 0457)  Assessment: 62 y.o. s/p Procedure(s): XI ROBOTIC ASSISTED TOTAL HYSTERECTOMY WITH BILATERAL SALPINGO OOPHORECTOMY, SENTINEL NODE BIOPSY: stable Pain:  Pain is  well-controlled on PRN medications.  Heme: Hgb 9.8 and Hct 30.3. Plan for repeat labs later this am to assess for stability given drop from preop values.   ID: WBC 12.9- given decadron and IV abx intra-op. No sign of infection.  CV: Tachycardic-pt stating she is in pain. BP stable. Will recheck labs later this am.   GI:  Tolerating po: Yes. Antiemetics ordered if needed.  GU: Voiding since surgery without difficulty. Creatinine 0.92    FEN: No critical values on am labs.  Prophylaxis: Lovenox ordered. No SCDs in the room.  Plan: Repeat CBC ordered for this am Lovenox dose on hold pending repeat labs Kpad to the room Plan for probable discharge later today if pain managed, labs stable   LOS: 0 days    Doylene Bode 03/12/2023, 8:07 AM

## 2023-03-12 NOTE — Plan of Care (Signed)
  Problem: Education: Goal: Knowledge of General Education information will improve Description: Including pain rating scale, medication(s)/side effects and non-pharmacologic comfort measures Outcome: Progressing   Problem: Activity: Goal: Risk for activity intolerance will decrease Outcome: Progressing   

## 2023-03-12 NOTE — Progress Notes (Signed)
Mobility Specialist - Progress Note   03/12/23 1002  Mobility  Activity Ambulated with assistance in hallway  Level of Assistance Standby assist, set-up cues, supervision of patient - no hands on  Assistive Device Front wheel walker  Distance Ambulated (ft) 275 ft  Activity Response Tolerated well  Mobility Referral Yes  $Mobility charge 1 Mobility  Mobility Specialist Start Time (ACUTE ONLY) 0845  Mobility Specialist Stop Time (ACUTE ONLY) 0857  Mobility Specialist Time Calculation (min) (ACUTE ONLY) 12 min   Pt received in bed and agreeable to mobility. No complaints during session. Pt to bed after session with all needs met.     [facility]

## 2023-03-13 LAB — BASIC METABOLIC PANEL
Anion gap: 9 (ref 5–15)
BUN: 10 mg/dL (ref 6–20)
CO2: 19 mmol/L — ABNORMAL LOW (ref 22–32)
Calcium: 8.6 mg/dL — ABNORMAL LOW (ref 8.9–10.3)
Chloride: 111 mmol/L (ref 98–111)
Creatinine, Ser: 0.61 mg/dL (ref 0.44–1.00)
GFR, Estimated: >60 mL/min (ref >60)
Glucose, Bld: 82 mg/dL (ref 70–99)
Potassium: 3.5 mmol/L (ref 3.5–5.1)
Sodium: 139 mmol/L (ref 135–145)

## 2023-03-13 LAB — CBC
HCT: 30.3 % — ABNORMAL LOW (ref 36.0–46.0)
Hemoglobin: 9.7 g/dL — ABNORMAL LOW (ref 12.0–15.0)
MCH: 26.9 pg (ref 26.0–34.0)
MCHC: 32 g/dL (ref 30.0–36.0)
MCV: 84.2 fL (ref 80.0–100.0)
Platelets: 298 10*3/uL (ref 150–400)
RBC: 3.6 MIL/uL — ABNORMAL LOW (ref 3.87–5.11)
RDW: 14.4 % (ref 11.5–15.5)
WBC: 9.1 10*3/uL (ref 4.0–10.5)
nRBC: 0 % (ref 0.0–0.2)

## 2023-03-13 MED ORDER — OXYCODONE HCL 5 MG PO TABS: 5.0000 mg | ORAL_TABLET | ORAL | 0 refills | Status: DC | PRN

## 2023-03-13 MED ORDER — SODIUM CHLORIDE 0.9% FLUSH: 3.0000 mL | Freq: Two times a day (BID) | INTRAVENOUS | Status: DC

## 2023-03-13 NOTE — Progress Notes (Signed)
AVS reviewed w/ pt who verbalized an understanding. PIV removed as noted. Pt medicated for pain as noted prior to d/c. No other questions at this time. Oxycodone prescription sent to pharmacy that is closed today- Dr Tamela Oddi contacted  via secure chat- med sent to [facility] on [address] in [place]. Pt to lobby via w/c to home.

## 2023-03-13 NOTE — Progress Notes (Signed)
Patient is refusing IV fluids and SCD's. MD made aware.

## 2023-03-13 NOTE — Discharge Summary (Signed)
Physician Discharge Summary  Patient ID: Beth Cole MRN: [id number] DOB/AGE: Jan 21, 1961 62 y.o.  Admit date: 03/11/2023 Discharge date: 03/13/2023  Admission Diagnoses: Endometrial cancer [facility])  Discharge Diagnoses:  Principal Problem:   Endometrial cancer [facility])   Discharged Condition: stable  Hospital Course: On 03/11/2023, the patient underwent the following: Procedure(s): XI ROBOTIC ASSISTED TOTAL HYSTERECTOMY WITH BILATERAL SALPINGO OOPHORECTOMY SENTINEL NODE BIOPSY.   The postoperative course was remarkable for postoperative anemia. After an unanticipated downward trend, her hemoglobin remained stable and she had minimal symptoms.  She remained mildly tachycardic which is her baseline in the setting of her chronic tobacco abuse.  She was discharged to home on postoperative day 2 tolerating a regular diet.   Consults: None  Significant Diagnostic Studies: None  Treatments: surgery: see above  Discharge Exam: Blood pressure 139/89, pulse (!) 109, temperature 98.4 F (36.9 C), temperature source Oral, resp. rate 16, height 5\' 2"  (1.575 m), weight 66.8 kg, last menstrual period 06/14/2014, SpO2 96%. General appearance: alert Resp: clear to auscultation bilaterally Cardio: regular rate and rhythm GI: soft, non-tender; bowel sounds normal; no masses,  no organomegaly Incision/Wound: C/D/I PV loss: none  Disposition: Discharge disposition: 01-Home or Self Care       Discharge Instructions     Call MD for:  difficulty breathing, headache or visual disturbances   Complete by: As directed    Call MD for:  extreme fatigue   Complete by: As directed    Call MD for:  extreme fatigue   Complete by: As directed    Call MD for:  persistant dizziness or light-headedness   Complete by: As directed    Call MD for:  persistant dizziness or light-headedness   Complete by: As directed    Call MD for:  persistant nausea and vomiting   Complete by: As directed     Call MD for:  persistant nausea and vomiting   Complete by: As directed    Call MD for:  redness, tenderness, or signs of infection (pain, swelling, redness, odor or green/yellow discharge around incision site)   Complete by: As directed    Call MD for:  redness, tenderness, or signs of infection (pain, swelling, redness, odor or green/yellow discharge around incision site)   Complete by: As directed    Call MD for:  severe uncontrolled pain   Complete by: As directed    Call MD for:  severe uncontrolled pain   Complete by: As directed    Call MD for:  temperature >100.4   Complete by: As directed    Call MD for:  temperature >100.4   Complete by: As directed    Diet - low sodium heart healthy   Complete by: As directed       Allergies as of 03/13/2023   No Known Allergies      Medication List     STOP taking these medications    megestrol 40 MG tablet Commonly known as: MEGACE   traMADol 50 MG tablet Commonly known as: ULTRAM       TAKE these medications    hydrochlorothiazide 50 MG tablet Commonly known as: HYDRODIURIL Take 50 mg by mouth daily.   loratadine 10 MG tablet Commonly known as: CLARITIN Take 10 mg by mouth daily as needed for allergies.   methimazole 5 MG tablet Commonly known as: TAPAZOLE Take 5 mg by mouth daily.   oxyCODONE 5 MG immediate release tablet Commonly known as: Oxy IR/ROXICODONE Take 1 tablet (5 mg total)  by mouth every 4 (four) hours as needed for severe pain (pain score 7-10). For AFTER surgery only, do not take and drive, do not take with other pain medications   senna-docusate 8.6-50 MG tablet Commonly known as: Senokot-S Take 2 tablets by mouth at bedtime. For AFTER surgery, do not take if having diarrhea        Follow-up Information     Clide Cliff, MD Follow up on 03/24/2023.   Specialty: Gynecologic Oncology Why: at 3:30pm at the [facility] information: [address] [place]  [address] [phone]                 Signed: Antionette Char 03/13/2023, 9:37 AM

## 2023-03-14 LAB — SURGICAL PATHOLOGY

## 2023-03-17 ENCOUNTER — Telehealth: Payer: Self-pay | Admitting: *Deleted

## 2023-03-17 DIAGNOSIS — C541 Malignant neoplasm of endometrium: Secondary | ICD-10-CM

## 2023-03-17 NOTE — Telephone Encounter (Signed)
2nd attempt to reach patient. Unable to leave voicemail, phone recording states, "not available at this time, please try your call again later".  Patient daughter notified and was told she would call her mother and advise to call the office back at [phone].

## 2023-03-17 NOTE — Telephone Encounter (Signed)
Attempted to reach patient for post op call.  Unable to leave message.

## 2023-03-18 MED ORDER — OXYCODONE HCL 5 MG PO TABS: 5.0000 mg | ORAL_TABLET | ORAL | 0 refills | Status: DC | PRN

## 2023-03-18 NOTE — Telephone Encounter (Signed)
Third attempt to reach patient for post op call. Left voicemail requesting call back to [phone].

## 2023-03-18 NOTE — Telephone Encounter (Signed)
Spoke with Ms. Mustapha this afternoon. She states she is eating, drinking and urinating well. She has had a BM and is passing gas. She is taking senokot as prescribed and encouraged her to drink plenty of water. She denies fever or chills. Incisions are dry and intact. She rates her pain 10/10 when she is getting out of bed. Pt denies shortness of breath, states she has a dry cough that contributes to the pain. Pt states it's her upper incision under breast that is the most uncomfortable. Reminded patient she has a deep suture and to make sure she is not lifting anything over 10 lbs and no reaching, pulling or pushing.  Advised patient to splint her incisions with a pillow and try to roll on her side before sitting up. Reviewed all post op activity restrictions. Her pain is controlled with oxycodone and tylenol.     Instructed to call office with any fever, chills, purulent drainage, uncontrolled pain or any other questions or concerns. Patient verbalizes understanding.   Pt aware of post op appointments as well as the office number [phone] and after hours number [phone] to call if she has any questions or concerns

## 2023-03-24 ENCOUNTER — Other Ambulatory Visit: Payer: Self-pay | Admitting: Oncology

## 2023-03-24 ENCOUNTER — Inpatient Hospital Stay: Payer: 59 | Attending: Psychiatry | Admitting: Psychiatry

## 2023-03-24 ENCOUNTER — Inpatient Hospital Stay: Payer: 59

## 2023-03-24 ENCOUNTER — Telehealth: Payer: Self-pay

## 2023-03-24 VITALS — BP 131/87 | HR 100 | Temp 98.7°F | Resp 20 | Wt 156.8 lb

## 2023-03-24 DIAGNOSIS — Z9079 Acquired absence of other genital organ(s): Secondary | ICD-10-CM | POA: Insufficient documentation

## 2023-03-24 DIAGNOSIS — R350 Frequency of micturition: Secondary | ICD-10-CM | POA: Insufficient documentation

## 2023-03-24 DIAGNOSIS — Z90710 Acquired absence of both cervix and uterus: Secondary | ICD-10-CM | POA: Diagnosis not present

## 2023-03-24 DIAGNOSIS — R300 Dysuria: Secondary | ICD-10-CM

## 2023-03-24 DIAGNOSIS — Z90722 Acquired absence of ovaries, bilateral: Secondary | ICD-10-CM | POA: Insufficient documentation

## 2023-03-24 DIAGNOSIS — R509 Fever, unspecified: Secondary | ICD-10-CM | POA: Diagnosis not present

## 2023-03-24 DIAGNOSIS — C541 Malignant neoplasm of endometrium: Secondary | ICD-10-CM | POA: Diagnosis not present

## 2023-03-24 DIAGNOSIS — Z7189 Other specified counseling: Secondary | ICD-10-CM

## 2023-03-24 DIAGNOSIS — R000 Tachycardia, unspecified: Secondary | ICD-10-CM

## 2023-03-24 LAB — URINALYSIS, COMPLETE (UACMP) WITH MICROSCOPIC
Bacteria, UA: NONE SEEN
Bilirubin Urine: NEGATIVE
Glucose, UA: NEGATIVE mg/dL
Ketones, ur: NEGATIVE mg/dL
Leukocytes,Ua: NEGATIVE
Nitrite: NEGATIVE
Protein, ur: NEGATIVE mg/dL
Specific Gravity, Urine: 1.026 (ref 1.005–1.030)
pH: 5 (ref 5.0–8.0)

## 2023-03-24 MED ORDER — OXYCODONE HCL 5 MG PO TABS: 5.0000 mg | ORAL_TABLET | ORAL | 0 refills | Status: DC | PRN

## 2023-03-24 MED ORDER — IBUPROFEN 800 MG PO TABS: 800.0000 mg | ORAL_TABLET | Freq: Three times a day (TID) | ORAL | 0 refills | Status: AC | PRN
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
## 2023-03-24 NOTE — Patient Instructions (Addendum)
Plan to follow up in one month with Dr. Alvester Morin.  There was blood in your urine. We will plan for labs today and will call you with the results.   We have sent in a refill of ibuprofen and refill on oxycodone as needed for severe pain.   We will arrange for you to meet with Dr. Antony Blackbird in Radiation Oncology here at the [facility].

## 2023-03-24 NOTE — Progress Notes (Signed)
Gynecologic Oncology Multi-Disciplinary Disposition Conference Note  Date of the Conference: 03/24/2023  Patient Name: Beth Cole  Referring Provider: Dr. Alysia Penna Primary GYN Oncologist: Dr. Alvester Morin   Stage/Disposition:  Grade 1 endometrioid endometrial cancer. Disposition is to referral to radiation oncology to consider vaginal brachytherapy.   This Multidisciplinary conference took place involving physicians from Gynecologic Oncology, Medical Oncology, Radiation Oncology, Pathology, Radiology along with the Gynecologic Oncology Nurse Practitioner and Gynecologic Oncology Nurse Navigator.  Comprehensive assessment of the patient's malignancy, staging, need for surgery, chemotherapy, radiation therapy, and need for further testing were reviewed. Supportive measures, both inpatient and following discharge were also discussed. The recommended plan of care is documented. Greater than 35 minutes were spent correlating and coordinating this patient's care.

## 2023-03-24 NOTE — Telephone Encounter (Signed)
Beth Cole called stating she has been sweating and chills x 3 nights. She has a clear discharge, no vaginal odor, no bleeding. She is having trouble peeing. No burning just hard for it to come out.She doesn't feel like she has a fever (no way to check it) She states she doesn't feel like coming in today for her post-op check,but knows she needs to.   Advised her to come in and we can do a urinalysis and also needs to have a pelvic exam so we can check to see about the discharge. Pt voiced an understanding.   Message sent to Dr.Newton for review

## 2023-03-25 ENCOUNTER — Telehealth: Payer: Self-pay

## 2023-03-25 ENCOUNTER — Encounter: Payer: Self-pay | Admitting: Oncology

## 2023-03-25 ENCOUNTER — Other Ambulatory Visit: Payer: Self-pay

## 2023-03-25 ENCOUNTER — Inpatient Hospital Stay: Payer: 59

## 2023-03-25 DIAGNOSIS — R1031 Right lower quadrant pain: Secondary | ICD-10-CM

## 2023-03-25 DIAGNOSIS — C541 Malignant neoplasm of endometrium: Secondary | ICD-10-CM

## 2023-03-25 DIAGNOSIS — Z90722 Acquired absence of ovaries, bilateral: Secondary | ICD-10-CM | POA: Diagnosis not present

## 2023-03-25 DIAGNOSIS — R350 Frequency of micturition: Secondary | ICD-10-CM | POA: Diagnosis not present

## 2023-03-25 DIAGNOSIS — Z9079 Acquired absence of other genital organ(s): Secondary | ICD-10-CM | POA: Diagnosis not present

## 2023-03-25 DIAGNOSIS — R509 Fever, unspecified: Secondary | ICD-10-CM | POA: Diagnosis not present

## 2023-03-25 DIAGNOSIS — Z90710 Acquired absence of both cervix and uterus: Secondary | ICD-10-CM | POA: Diagnosis not present

## 2023-03-25 LAB — CBC (CANCER CENTER ONLY)
HCT: 38 % (ref 36.0–46.0)
Hemoglobin: 12.3 g/dL (ref 12.0–15.0)
MCH: 27 pg (ref 26.0–34.0)
MCHC: 32.4 g/dL (ref 30.0–36.0)
MCV: 83.3 fL (ref 80.0–100.0)
Platelet Count: 429 10*3/uL — ABNORMAL HIGH (ref 150–400)
RBC: 4.56 MIL/uL (ref 3.87–5.11)
RDW: 14.6 % (ref 11.5–15.5)
WBC Count: 10.5 10*3/uL (ref 4.0–10.5)
nRBC: 0 % (ref 0.0–0.2)

## 2023-03-25 LAB — BASIC METABOLIC PANEL
Anion gap: 7 (ref 5–15)
BUN: 10 mg/dL (ref 6–20)
CO2: 32 mmol/L (ref 22–32)
Calcium: 10 mg/dL (ref 8.9–10.3)
Chloride: 101 mmol/L (ref 98–111)
Creatinine, Ser: 0.8 mg/dL (ref 0.44–1.00)
GFR, Estimated: >60 mL/min (ref >60)
Glucose, Bld: 135 mg/dL — ABNORMAL HIGH (ref 70–99)
Potassium: 3.5 mmol/L (ref 3.5–5.1)
Sodium: 140 mmol/L (ref 135–145)

## 2023-03-25 NOTE — Telephone Encounter (Signed)
-----   Message from Doylene Bode sent at 03/25/2023  4:06 PM EST ----- Please let the patient know her labs looks stable. Her blood count has increased into normal range from surgery. Electrolytes are normal. Would make sure she is staying hydrated. Glucose slightly elevated on labs but assuming she was prob not fasting. You can confirm this.

## 2023-03-25 NOTE — Telephone Encounter (Signed)
Lvm for patient to call back regarding recent lab results.

## 2023-03-25 NOTE — Progress Notes (Signed)
Referral placed for radiation oncology per Dr. Alvester Morin.

## 2023-03-26 NOTE — Telephone Encounter (Signed)
Left voicemail for patient to call office for recent lab results.

## 2023-03-27 NOTE — Telephone Encounter (Signed)
Pt is aware of recent lab results. She states she will try and stay hydrated, she was not fasting before her appointment.  Pt aware of follow up appointment with Dr. Alvester Morin on 04/21/23.

## 2023-03-31 ENCOUNTER — Encounter: Payer: Self-pay | Admitting: Psychiatry

## 2023-04-01 LAB — MOLECULAR PATHOLOGY

## 2023-04-02 ENCOUNTER — Ambulatory Visit: Payer: 59

## 2023-04-02 ENCOUNTER — Ambulatory Visit
Admission: RE | Admit: 2023-04-02 | Discharge: 2023-04-02 | Disposition: A | Discharge status: Home / Self Care | Payer: 59 | Source: Ambulatory Visit | Attending: Radiation Oncology | Admitting: Radiation Oncology

## 2023-04-02 DIAGNOSIS — C541 Malignant neoplasm of endometrium: Secondary | ICD-10-CM

## 2023-04-03 ENCOUNTER — Telehealth: Payer: Self-pay | Admitting: Radiation Oncology

## 2023-04-03 NOTE — Telephone Encounter (Signed)
Called patient to r/s consultation w. Dr. Roselind Messier. Patient stated she recently fell down a flight of stairs, prefers to speak with Dr. Alvester Morin prior to scheduling.

## 2023-04-07 ENCOUNTER — Telehealth: Payer: Self-pay | Admitting: Radiation Oncology

## 2023-04-07 NOTE — Telephone Encounter (Signed)
Called patient to follow up on r/s consultation w. Dr. Roselind Messier. No answer, LVM for a return call.

## 2023-04-08 ENCOUNTER — Telehealth: Payer: Self-pay | Admitting: Radiation Oncology

## 2023-04-08 NOTE — Telephone Encounter (Signed)
Sent unable to contact letter 12/24.

## 2023-04-08 NOTE — Telephone Encounter (Signed)
Called patient to r/s consultation w. Dr. Roselind Messier. No answer, LVM for a return call.

## 2023-04-15 ENCOUNTER — Ambulatory Visit
Admission: RE | Admit: 2023-04-15 | Discharge: 2023-04-15 | Discharge status: Against Medical Advice | Payer: 59 | Source: Ambulatory Visit

## 2023-04-16 ENCOUNTER — Inpatient Hospital Stay: Admit: 2023-04-16 | Discharge: 2023-04-16 | Discharge status: Home / Self Care | Payer: 59

## 2023-04-16 ENCOUNTER — Ambulatory Visit
Admission: EM | Admit: 2023-04-16 | Discharge: 2023-04-16 | Disposition: A | Discharge status: Home / Self Care | Payer: 59 | Attending: Physician Assistant | Admitting: Physician Assistant

## 2023-04-16 DIAGNOSIS — N611 Abscess of the breast and nipple: Secondary | ICD-10-CM | POA: Diagnosis not present

## 2023-04-16 MED ORDER — DOXYCYCLINE HYCLATE 100 MG PO CAPS: 100.0000 mg | ORAL_CAPSULE | Freq: Two times a day (BID) | ORAL | 0 refills | Status: AC

## 2023-04-16 NOTE — Discharge Instructions (Signed)
  Please follow up with PCP next month when able to schedule.   Follow up sooner with persistent or worsening symptoms.

## 2023-04-16 NOTE — ED Triage Notes (Addendum)
 Patient present with lump on right breast x 1 month. Sore to touch. Treated with ibuprofen 800, states she need something for pain. PCP could not see her until February.

## 2023-04-20 ENCOUNTER — Encounter: Payer: Self-pay | Admitting: Physician Assistant
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
## 2023-04-21 ENCOUNTER — Ambulatory Visit: Payer: 59 | Admitting: Psychiatry

## 2023-04-21 DIAGNOSIS — C541 Malignant neoplasm of endometrium: Secondary | ICD-10-CM

## 2023-04-21 NOTE — Progress Notes (Signed)
This encounter was created in error - please disregard.no show

## 2023-04-22 ENCOUNTER — Telehealth: Payer: Self-pay | Admitting: Radiation Oncology

## 2023-04-22 NOTE — Telephone Encounter (Signed)
 Unable to contact patient to reschedule consultation w. Dr. Roselind Messier. Closing referral until further notice. Dr. Hecla Blas office notified.

## 2023-09-18 IMAGING — US US PELVIS COMPLETE WITH TRANSVAGINAL
1 series · 13 of 25 positions shown · non-contrast
Comparison: Pelvic ultrasound 08/13/2007

CLINICAL DATA: Left lower quadrant pain

EXAM:
TRANSABDOMINAL AND TRANSVAGINAL ULTRASOUND OF PELVIS
TECHNIQUE: Both transabdominal and transvaginal ultrasound examinations of the
pelvis were performed. Transabdominal technique was performed for
global imaging of the pelvis including uterus, ovaries, adnexal
regions, and pelvic cul-de-sac. It was necessary to proceed with
endovaginal exam following the transabdominal exam to visualize the
endometrium and adnexa.

[Series 1: us pelvis complete with transvaginal · 0.25mm/px · 73 acquisitions, 13 frames shown]
[im 1/73]
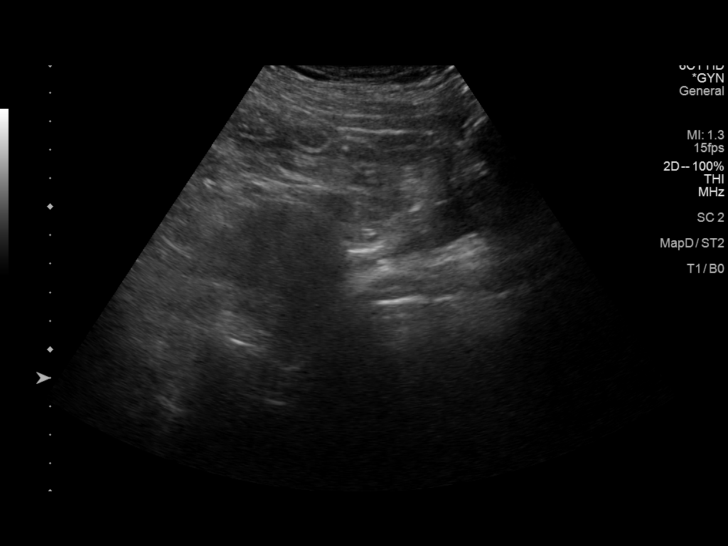
[im 7/73]
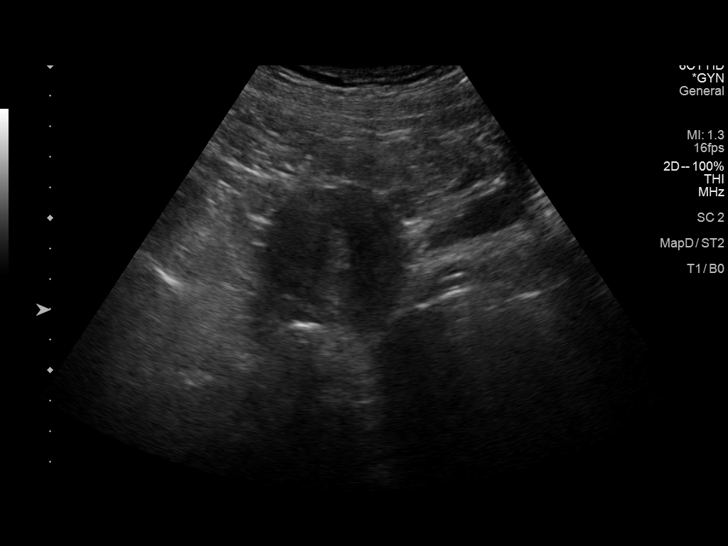
[im 13/73]
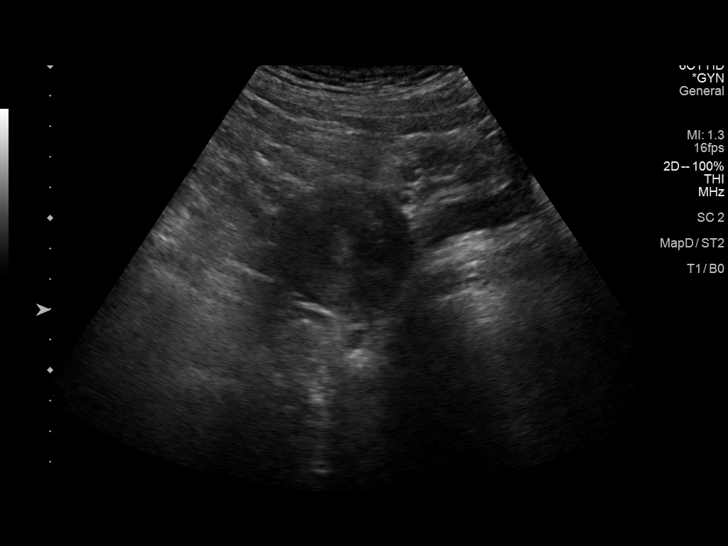
[im 19/73]
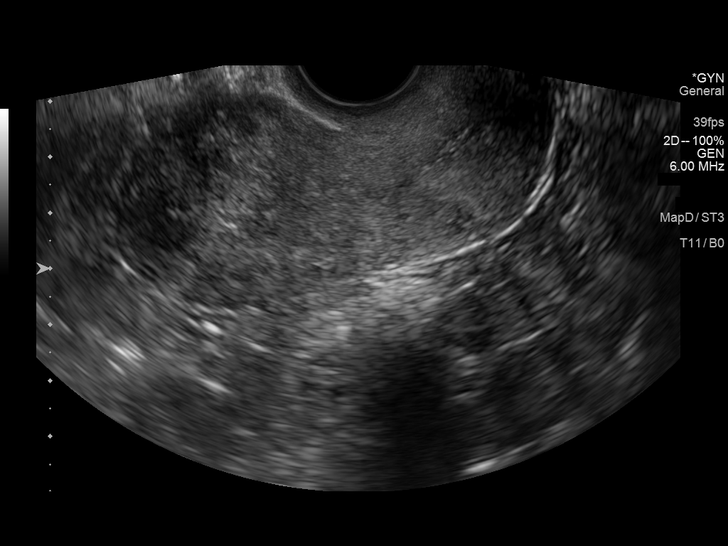
[im 25/73]
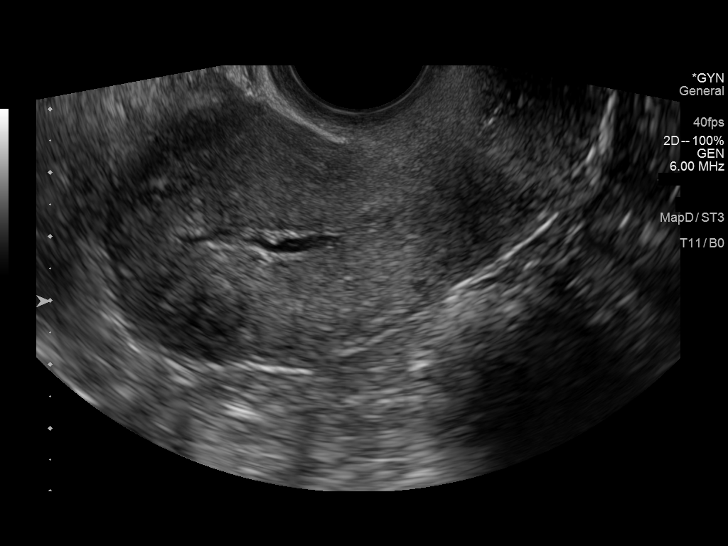
[im 31/73]
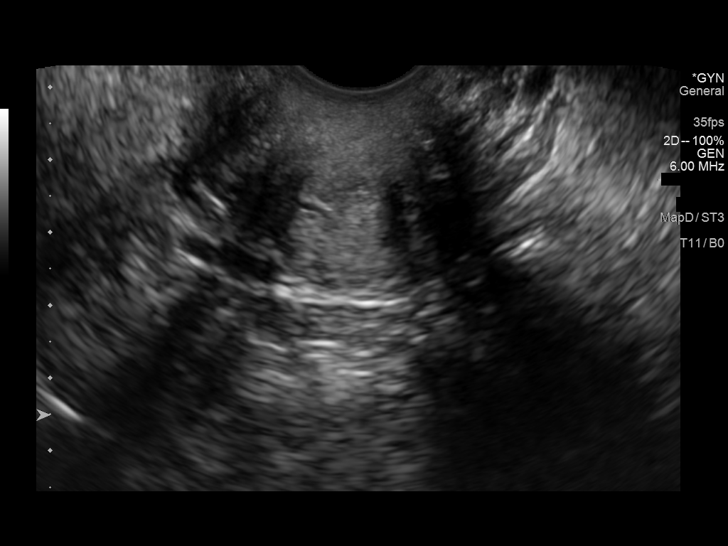
[im 37/73]
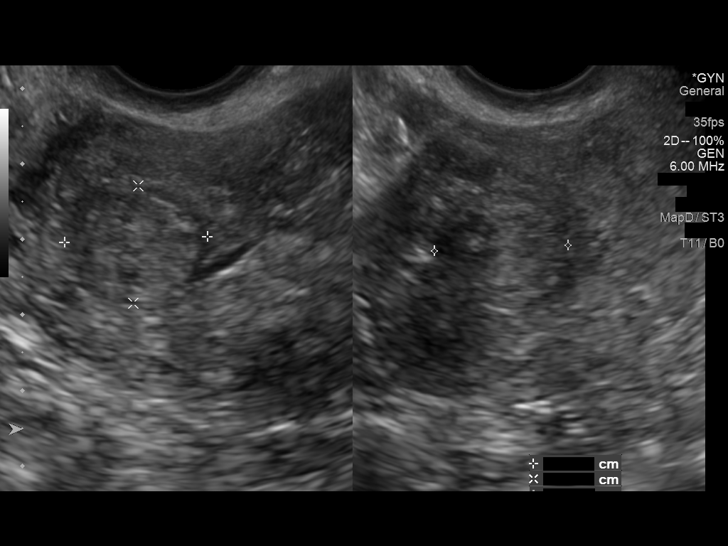
[im 43/73]
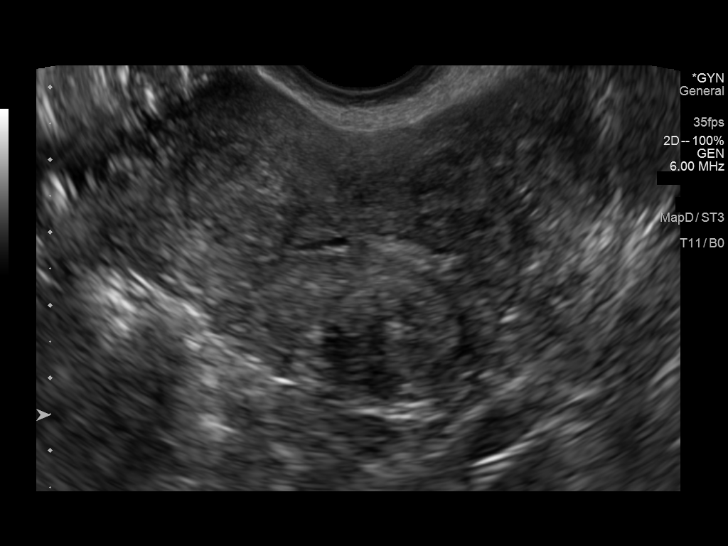
[im 49/73]
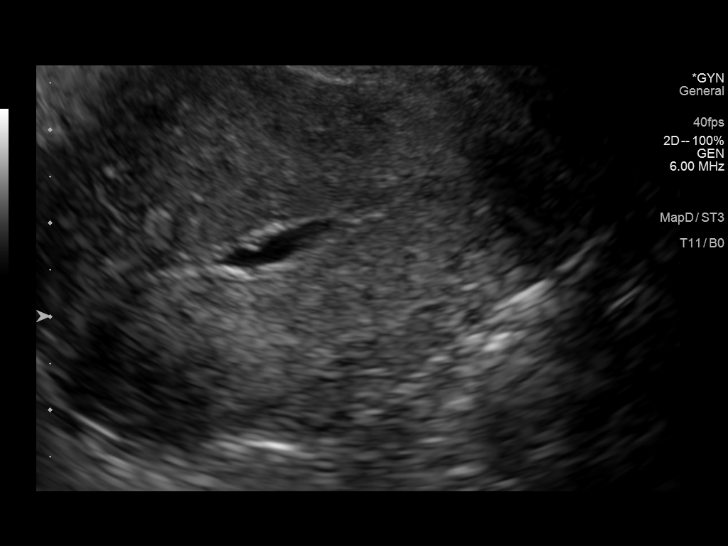
[im 55/73]
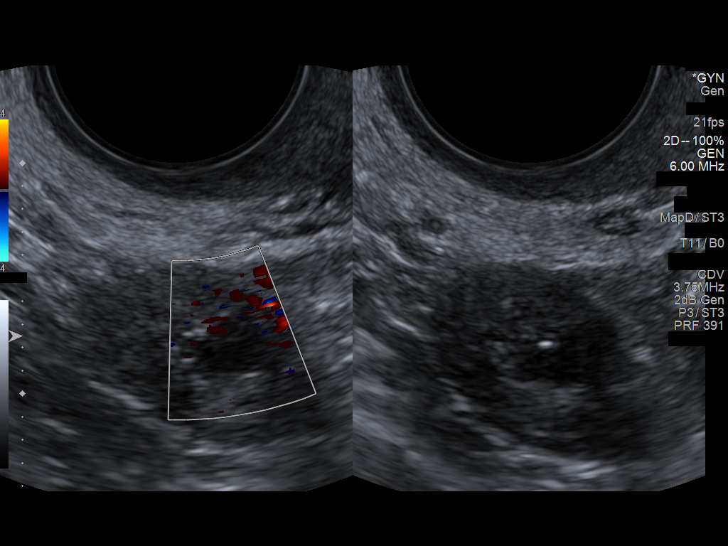
[im 61/73]
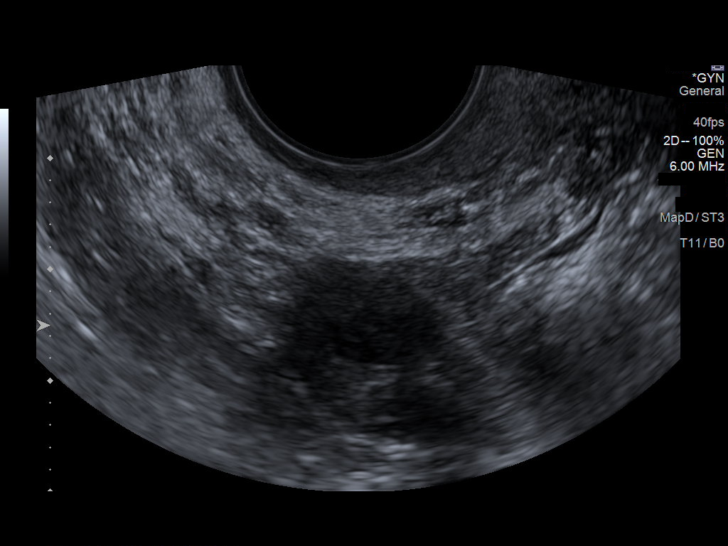
[im 67/73]
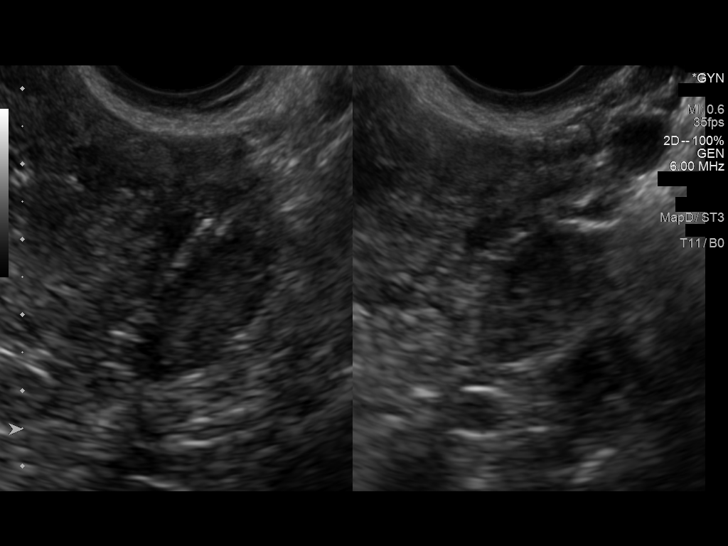
[im 73/73]
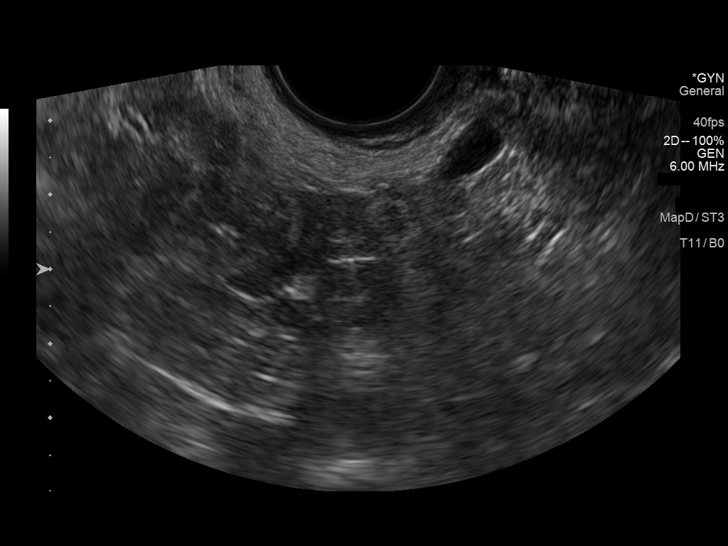

[13 of 25 positions shown; findings below may reference images not displayed]

FINDINGS: Uterus

Measurements: 7.6 x 4.2 x 5.3 cm = volume: 80 mL. Diffusely
inhomogeneous appearance of the myometrium with several rounded
isoechoic to slightly hypoechoic masses which are consistent with
fibroids. Largest fibroids include submucosal/intramural measuring
1.9 x 1.8 cm, and intramural/subserosal measuring 2 x 2.1 cm.

Endometrium

Thickness: 2 mm.  Small amount of endometrial canal fluid.

Right ovary

Measurements: 1.7 x 2.1 x 2.6 cm = volume: 5 mL. 0.6 cm
follicle/cyst.

Left ovary

Measurements: 2.5 x 1.2 x 1.4 cm = volume: 2 mL. Normal
appearance/no adnexal mass.

Other findings

No abnormal free fluid.
IMPRESSION: Heterogeneous fibroid uterus.  Small endometrial canal fluid.

## 2023-09-21 ENCOUNTER — Ambulatory Visit
Admission: EM | Admit: 2023-09-21 | Discharge: 2023-09-21 | Disposition: A | Discharge status: Home / Self Care | Payer: MEDICAID

## 2023-09-21 ENCOUNTER — Encounter (HOSPITAL_COMMUNITY): Payer: Self-pay

## 2023-09-21 ENCOUNTER — Other Ambulatory Visit: Payer: Self-pay

## 2023-09-21 ENCOUNTER — Emergency Department (HOSPITAL_COMMUNITY)
Admission: EM | Admit: 2023-09-21 | Discharge: 2023-09-21 | Discharge status: Against Medical Advice | Payer: MEDICAID | Attending: Emergency Medicine | Admitting: Emergency Medicine

## 2023-09-21 DIAGNOSIS — M79641 Pain in right hand: Secondary | ICD-10-CM | POA: Diagnosis not present

## 2023-09-21 DIAGNOSIS — R200 Anesthesia of skin: Secondary | ICD-10-CM | POA: Diagnosis present

## 2023-09-21 DIAGNOSIS — E0590 Thyrotoxicosis, unspecified without thyrotoxic crisis or storm: Secondary | ICD-10-CM | POA: Insufficient documentation

## 2023-09-21 DIAGNOSIS — M7989 Other specified soft tissue disorders: Secondary | ICD-10-CM | POA: Diagnosis not present

## 2023-09-21 DIAGNOSIS — Z5321 Procedure and treatment not carried out due to patient leaving prior to being seen by health care provider: Secondary | ICD-10-CM | POA: Insufficient documentation

## 2023-09-21 DIAGNOSIS — R202 Paresthesia of skin: Secondary | ICD-10-CM

## 2023-09-21 NOTE — ED Triage Notes (Signed)
"  I am feeling numbness in my feet for about a week now with now my hands swelling". "This has not happened before, I can walk but numb".  No other concerns.

## 2023-09-21 NOTE — ED Notes (Signed)
 Patient is being discharged from the Urgent Care and sent to the Emergency Department via Private Vehicle (Self) . Per Provider, patient is in need of higher level of care due to New Onset feet (numbness) and hand swelling (labs/evaluation). Patient is aware and verbalizes understanding of plan of care. There were no vitals filed for this visit.

## 2023-09-21 NOTE — ED Triage Notes (Addendum)
 Pt arrives via POV. Pt reports she has been experiencing numbness and pins and needle sensation in her left foot for about 1 week. She also reports pain and swelling to her right hand since last night. Pt is AxOx4. Pt denies any other symptoms. Denies injury to her hand.

## 2023-09-21 NOTE — ED Provider Notes (Signed)
 Patient presents to urgent care for new onset numbness in her feet that started about a week ago and is not resolving. She reports that she has not had this happen before. She denies any other symptoms with exception of swelling to her hands. Given her past medical history and new onset of symptoms recommended further work up in the ED as I suspect she will need stat labs and possibly imaging. Patient is agreeable with plan.   Vernestine Gondola, PA-C 09/21/23 1233

## 2023-09-21 NOTE — ED Triage Notes (Signed)
 Due to reason for visit patient referred to ED for management/care. Discussed with provider during intake. Patient opt'd to head on, V/S and full intake not completed.

## 2023-09-24 NOTE — Progress Notes (Signed)
 Subjective   Beth Cole is a 63 year old female here for Numbness (Pt has had left foot numbness and tingling. Hands were swollen. Went to ED but left ama bc of wait. She also mentions left abdominal pain Hasn't taken/used anything OTC.) and Prescription Refill Request (Needs refill on vitD,hydro, and inhaler.)  She was recently seen in the ED on 09/21/23 and left AMA d/t the prolonged wait time. She endorses having left foot numbness and tingling x 1 week.  She is noted to have a hx. Of hyperthyroidism.   She is here today alone and begins discussing various concerns at once.  Regarding her numbness and tingling in her feet, she states that this has been going on intermittently for 1-2 weeks.  She is concerned if she has diabetes as she recently lost her closest friend to diabetes and cardiac issues.  She wants to know if she is diabetic or if her prediabetes is close to her being a diabetic. If everything is normal, then she is requesting a medication to her improve her symptoms.  She then begins to state that she is taking her methimazole  daily for her thyroid and has not yet to schedule an appointment with endocrine specialist. She is requesting assistance with scheduling this appointment and states that she can do any day of the week after 2 PM.  She then also shares that she has been having nasal congestion, runny nose with greenish-yellow mucus production, sneezing and facial pain for almost 2 weeks now.  She states that she has been taking her Zyrtec daily but would like to know if she can have something to treat what she believes is a sinus infection. Patient was initially agreeable to lab work.  However, her Beth Cole arrived and she left prior to labs being completed.   Documentation Reviewed by Any User: Tobacco  Allergies  Meds  Med Hx   Surg Hx  Fam Hx  Soc Hx    Current Outpatient Medications  Medication Sig Dispense Refill  . ergocalciferol (VITAMIN D-2) 1,250 mcg (50,000 unit)  capsule Take 1 Capsule by mouth once a week (Patient not taking: Reported on 09/24/2023.) 8 Capsule 0  . hydroCHLOROthiazide (HYDRODIURIL) 25 mg tablet Take 1 Tablet by mouth once daily (Patient not taking: Reported on 09/24/2023.) 30 Tablet 2  . methIMAzole  (TAPAZOLE ) 5 mg tablet Take 1 Tablet by mouth daily for 90 days 30 Tablet 2  . pantoprazole (PROTONIX) 40 mg EC tablet Take 1 Tablet by mouth daily. for 90 days 90 Tablet 0  . albuterol  HFA 90 mcg/actuation inhaler INHALE 2 PUFFS INTO THE LUNGS EVERY 4 (FOUR) HOURS AS NEEDED FOR SHORTNESS OF BREATH (Patient not taking: Reported on 09/24/2023.) 8.5 Each 5  . fluticasone  (FLONASE ) 50 mcg/actuation nasal spray Place 2 Sprays in both nostrils once daily inhale 2 sprays (100 mcg) in each nostril by intranasal route once daily 16 g 1  . buPROPion SR (WELLBUTRIN SR) 150 mg 12 hr tablet TAKE 1 TABLET SUSTAINED-RELEASE 12 HR BY ORAL ROUTE 2 TIMES A DAY (Patient not taking: Reported on 09/24/2023.) 60 Tablet 0  . saliva substitute combo no.9 (BIOTENE DRY MOUTH ORAL RINSE) mwsh use as directed by oral route 2 times a day as needed (Patient not taking: Reported on 09/24/2023.)    . cetirizine (ZYRTEC) 10 mg tablet Take 1 Tablet by mouth daily.     Review of Systems  Constitutional:  Negative for appetite change, chills, diaphoresis, fatigue and fever.  HENT:  Positive for  congestion, rhinorrhea, sinus pressure, sinus pain and sneezing. Negative for sore throat.   Respiratory:  Negative for shortness of breath.   Cardiovascular:  Negative for chest pain and palpitations.  Neurological:  Positive for numbness.  Psychiatric/Behavioral:  Negative for self-injury and suicidal ideas.    Objective    BP (!) 141/100  Pulse (!) 105  Temp 97.6 F (36.4 C)  Ht 5' 1 (1.549 m)  Wt 157 lb (71.2 kg)  LMP  (LMP Unknown)  SpO2 96%  BMI 29.66 kg/m  OB Status Postmenopausal  Smoking Status Every Day  BSA 1.75 m  Physical Exam Vitals and nursing note reviewed.    Constitutional:      Appearance: Normal appearance.   Cardiovascular:     Rate and Rhythm: Tachycardia present.  Pulmonary:     Effort: Pulmonary effort is normal.  Neurological:     Mental Status: Beth Cole is alert and oriented to person, place, and time. Mental status is at baseline.  Psychiatric:        Mood and Affect: Mood is anxious.        Thought Content: Thought content normal.        Cognition and Memory: Cognition normal.        Judgment: Judgment normal.     Comments: Behavior is erractic     Assessment and Plan   1. Primary hypertension (Primary) -Elevated BP on exam but before recheck could be completed. -Provider suspects noncompliance with blood pressure regimen and patient left prior to labs being drawn. Was unable to complete full assessment. -Will assess renal function today and follow-up based on results. - hydroCHLOROthiazide (HYDRODIURIL) 25 mg tablet; Take 1 Tablet by mouth once daily.  Dispense: 30 Tablet; Refill: 2 - COMPREHENSIVE METABOLIC PANEL Routine; Future - LIPID PANEL Routine; Future  2. Shortness of breath -Refilled per pt. Request - albuterol  HFA 90 mcg/actuation inhaler; Inhale 2 Puffs into the lungs every 4 (four) hours as needed for shortness of breath.  Dispense: 8.5 Each; Refill: 6  3. Vitamin D deficiency -Was noted to be decreased back in February.  Will refill vitamin D again and instructed to take as prescribed. -Once completed, advised to take OTC vitamin D2 or D3 2000 IU daily. - ergocalciferol (VITAMIN D-2) 1,250 mcg (50,000 unit) capsule; Take 1 Capsule by mouth once a week.  Dispense: 12 Capsule; Refill: 0  4. Paresthesias -This provider believes that it may be related to her uncontrolled hyperthyroid disease. -Will assess other labs as well to rule out etiology.  However, patient left prior to having labs assessed. -She contacted the office back requesting assistance with her symptoms and a prescription for gabapentin was  given to see if this aids in the relief of her symptoms. -Instructed to schedule a lab only visit to assess labs when she is able. - FE+CBC/D/PLT/TIBC/FER/RETIC Routine; Future - VITAMIN B12 & FOLATE Routine; Future - TSH + FREE T4 Routine; Future  5. Prediabetes -A1C today: 5.9%; educated on diet and lifestyle changes to avoid worsening A1c. - A1C, ALERE AFINION (POCT) Routine  6. Hyperthyroidism -Continues on methimazole  daily.  Has not yet scheduled an appointment with endocrine specialist for management and is requesting assistance with scheduling this appointment. -Message sent to scheduling team to assist with coordination and scheduling specialist appointment. - TSH + FREE T4 Routine; Future  Regulatory Documentation: The following were addressed in today's visit: Tobacco counseling: provided tobacco cessation counseling  Counseling time: < 3 minutes  -Instructed to RTC in  22month for f/u re: paresthesias   Annabella Rigg, NP Family Nurse Practitioner [facility]

## 2023-12-04 ENCOUNTER — Encounter: Payer: Self-pay | Admitting: Nurse Practitioner

## 2023-12-04 ENCOUNTER — Telehealth (INDEPENDENT_AMBULATORY_CARE_PROVIDER_SITE_OTHER): Payer: MEDICAID | Admitting: Nurse Practitioner

## 2023-12-04 ENCOUNTER — Other Ambulatory Visit (HOSPITAL_COMMUNITY): Payer: Self-pay

## 2023-12-04 VITALS — BP 149/85 | HR 108 | Resp 16 | Ht 64.0 in | Wt 163.6 lb

## 2023-12-04 DIAGNOSIS — J40 Bronchitis, not specified as acute or chronic: Secondary | ICD-10-CM

## 2023-12-04 DIAGNOSIS — Z113 Encounter for screening for infections with a predominantly sexual mode of transmission: Secondary | ICD-10-CM

## 2023-12-04 MED ORDER — PREDNISONE 20 MG PO TABS
20.0000 mg | ORAL_TABLET | Freq: Two times a day (BID) | ORAL | 0 refills | Status: AC
  Filled 2023-12-04 – 2023-12-11 (×4): qty 10, 5d supply, fill #0

## 2023-12-04 MED ORDER — ALBUTEROL SULFATE HFA 108 (90 BASE) MCG/ACT IN AERS
2.0000 | INHALATION_SPRAY | Freq: Four times a day (QID) | RESPIRATORY_TRACT | 0 refills | Status: AC | PRN
  Filled 2023-12-04 – 2023-12-11 (×4): qty 18, 30d supply, fill #0

## 2023-12-04 MED ORDER — DOXYCYCLINE HYCLATE 100 MG PO TABS
100.0000 mg | ORAL_TABLET | Freq: Two times a day (BID) | ORAL | 0 refills | Status: AC
  Filled 2023-12-04 – 2023-12-11 (×4): qty 14, 7d supply, fill #0

## 2023-12-04 NOTE — Progress Notes (Signed)
 Acute Office Visit Patient and provider in person   Virtual Visit Consent:   Brandon Ferguson-Mustapha, you are scheduled for a virtual visit with a [facility] provider today.     Subjective:    Location: Patient: [facility] Provider: Virtual Visit Location Provider: Office/Clinic     Chief Complaint  Patient presents with   Cough    HPI Beth Cole is a 63 y.o. who identifies as a female who was assigned female at birth, and is being seen today for complaints of cough. She has a medical history significant for HTN, Pre diabetes, Hyperthyroidism, nicotine  dependence, vitamin D deficiency, hyperlipidemia, Bipolar, she was also treated for endometrial cancer in December of last yeast.    She is an established patient of [facility] and was most recently seen in June of this year. She presents to [facility] today because she knows she needs to be seen and has no mode of transportation anywhere else.   She does suffer from recurrent bronchitis, she feels that this happens often when people smoke around her. She denies a history of asthma but has used an inhaler for treatment of bronchitis and also what she describes as exercise induced wheezing.   She has a productive cough now, is without her inhaler due to cost. No fever but has been warm  She does have a history of , she has smoked a pack a day for 40 years review of chart did not return with any recent chest imaging   She has nasal congestion along with her chest congestion today    Patient also expresses concern over vaginal discharge that she has had since being sexually active with a new partner and would like STI testing today   States the discharge has an odor and does cause itching at times no color to discharge   Review of Systems  Constitutional:  Positive for chills and malaise/fatigue.  Respiratory:  Positive for cough, sputum production and wheezing.   Skin: Negative.          Objective:    LMP 06/14/2014 (Approximate)  BP Readings from Last 3 Encounters:  12/04/23 (!) 149/85  09/21/23 (!) 140/95  04/16/23 (!) 147/92      Physical Exam Constitutional:      Appearance: She is ill-appearing.  HENT:     Nose: Congestion present.     Mouth/Throat:     Mouth: Mucous membranes are moist.  Cardiovascular:     Rate and Rhythm: Normal rate and regular rhythm.  Pulmonary:     Breath sounds: Wheezing and rhonchi present.  Musculoskeletal:     Cervical back: Neck supple.  Skin:    General: Skin is warm.  Neurological:     Mental Status: She is alert and oriented to person, place, and time.     Assessment & Plan:    1. Bronchitis (Primary)  - doxycycline  (VIBRA -TABS) 100 MG tablet; Take 1 tablet (100 mg total) by mouth 2 (two) times daily for 7 days.  Dispense: 14 tablet; Refill: 0 - albuterol  (VENTOLIN  HFA) 108 (90 Base) MCG/ACT inhaler; Inhale 2 puffs into the lungs every 6 (six) hours as needed for wheezing or shortness of breath.  Dispense: 8 g; Refill: 0 - predniSONE  (DELTASONE ) 20 MG tablet; Take 1 tablet (20 mg total) by mouth 2 (two) times daily with a meal for 5 days.  Dispense: 10 tablet; Refill: 0  2. Screening examination for STI  - Chlamydia/Gonococcus/Trichomonas, NAA    Will  follow up with patient regarding lab results  Suspicious for underlying COPD- will discuss with patient further recommended scheduled health maintenance options at follow up   If she would like to move primary care services to [facility] due to convenience of location and her transportation needs   Strict follow up precautions discussed with patient for new/worsening symptoms   Follow Up Instructions: I discussed the assessment and treatment plan with the patient. The patient was provided an opportunity to ask questions and all were answered. The patient agreed with the plan and demonstrated an understanding of the instructions.  A copy of instructions were sent to the  patient via MyChart unless otherwise noted below.    The patient was advised to call back or seek an in-person evaluation if the symptoms worsen or if the condition fails to improve as anticipated.    [name], FNP  **Disclaimer: This note may have been dictated with voice recognition software. Similar sounding words can inadvertently be transcribed and this note may contain transcription errors which may not have been corrected upon publication of note.**

## 2023-12-04 NOTE — Progress Notes (Signed)
 Pt presents for cough -clear productive  -pain in right breast-sharp pains  -

## 2023-12-04 NOTE — Patient Instructions (Addendum)
 [facility] for mail delivery  [phone]

## 2023-12-07 LAB — CHLAMYDIA/GONOCOCCUS/TRICHOMONAS, NAA
Chlamydia by NAA: NEGATIVE
Gonococcus by NAA: NEGATIVE
Trich vag by NAA: NEGATIVE

## 2023-12-09 ENCOUNTER — Other Ambulatory Visit (HOSPITAL_COMMUNITY): Payer: Self-pay

## 2023-12-10 ENCOUNTER — Ambulatory Visit: Payer: Self-pay | Admitting: Nurse Practitioner

## 2023-12-11 ENCOUNTER — Telehealth: Payer: MEDICAID | Admitting: Nurse Practitioner

## 2023-12-11 ENCOUNTER — Encounter: Payer: Self-pay | Admitting: Pharmacist

## 2023-12-11 ENCOUNTER — Other Ambulatory Visit: Payer: Self-pay

## 2023-12-11 ENCOUNTER — Other Ambulatory Visit (HOSPITAL_COMMUNITY): Payer: Self-pay

## 2023-12-11 DIAGNOSIS — T3695XA Adverse effect of unspecified systemic antibiotic, initial encounter: Secondary | ICD-10-CM | POA: Diagnosis not present

## 2023-12-11 DIAGNOSIS — B379 Candidiasis, unspecified: Secondary | ICD-10-CM

## 2023-12-11 MED ORDER — FLUCONAZOLE 150 MG PO TABS
150.0000 mg | ORAL_TABLET | Freq: Once | ORAL | 0 refills | Status: AC
  Filled 2023-12-11: qty 1, 1d supply, fill #0

## 2023-12-11 NOTE — Progress Notes (Signed)
 Virtual Visit Consent   Beth Cole, you are scheduled for a virtual visit with a [facility] provider today. Just as with appointments in the office, your consent must be obtained to participate. Your consent will be active for this visit and any virtual visit you may have with one of our providers in the next 365 days. If you have a MyChart account, a copy of this consent can be sent to you electronically.  As this is a virtual visit, video technology does not allow for your provider to perform a traditional examination. This may limit your provider's ability to fully assess your condition. If your provider identifies any concerns that need to be evaluated in person or the need to arrange testing (such as labs, EKG, etc.), we will make arrangements to do so. Although advances in technology are sophisticated, we cannot ensure that it will always work on either your end or our end. If the connection with a video visit is poor, the visit may have to be switched to a telephone visit. With either a video or telephone visit, we are not always able to ensure that we have a secure connection.  By engaging in this virtual visit, you consent to the provision of healthcare and authorize for your insurance to be billed (if applicable) for the services provided during this visit. Depending on your insurance coverage, you may receive a charge related to this service.  I need to obtain your verbal consent now. Are you willing to proceed with your visit today? Beth Cole has provided verbal consent on 12/11/2023 for a virtual visit (video or telephone). [name], FNP  Date: 12/11/2023 3:19 PM   Virtual Visit via Video Note   I, [name], connected with  Beth Cole  ([id number], June 25, 1960) on 12/11/23 at  3:40 PM EDT by a video-enabled telemedicine application and verified that I am speaking with the correct person using two  identifiers.  Location: Patient: Virtual Visit Location Patient: Home Provider: Virtual Visit Location Provider: Home Office   I discussed the limitations of evaluation and management by telemedicine and the availability of in person appointments. The patient expressed understanding and agreed to proceed.    History of Present Illness: Beth Cole is a 63 y.o. who identifies as a female who was assigned female at birth, and is being seen today for follow up from her visit last week.   She is still suffering from some vaginal itching and discharge. Her STI results were negative.  She feels that there is an odor to her discharge but is unsure.  She was able to contact the pharmacy today and has her antibiotics set up for delivery tomorrow     Problems:  Patient Active Problem List   Diagnosis Date Noted   Hyperthyroidism 09/21/2023   Endometrial cancer (HCC) 03/11/2023   Postmenopausal bleeding 12/04/2022   Screening mammogram, encounter for 12/04/2022   Trigger finger, left 01/25/2021   Prediabetes 11/22/2020   Vitamin D deficiency 11/22/2020   Alcohol dependence, uncomplicated (HCC) 01/27/2019   Bipolar II disorder (HCC) 01/27/2019   Opioid abuse, uncomplicated (HCC) 01/27/2019   Nicotine  dependence, unspecified, uncomplicated 01/27/2019   Bipolar disorder with psychotic features (HCC) 01/27/2019   Hyperlipidemia 11/17/2017   Insomnia 09/10/2017   Overweight (BMI 25.0-29.9) 09/10/2017   Tobacco dependence syndrome 09/10/2017   Hypertension 09/10/2017   Female pelvic pain 11/03/2013   Female genital symptoms 05/06/2013   Urinary incontinence 04/06/2013   Leiomyoma of uterus, unspecified 04/06/2013  Allergies: No Known Allergies Medications:  Current Outpatient Medications:    albuterol  (VENTOLIN  HFA) 108 (90 Base) MCG/ACT inhaler, Inhale 2 puffs into the lungs every 4 (four) hours as needed for wheezing or shortness of breath., Disp: , Rfl:     albuterol  (VENTOLIN  HFA) 108 (90 Base) MCG/ACT inhaler, Inhale 2 puffs into the lungs every 6 (six) hours as needed for wheezing or shortness of breath., Disp: 18 g, Rfl: 0   doxycycline  (VIBRA -TABS) 100 MG tablet, Take 1 tablet (100 mg total) by mouth 2 (two) times daily for 7 days., Disp: 14 tablet, Rfl: 0   Ergocalciferol (VITAMIN D2 PO), Take 1 capsule by mouth once a week., Disp: , Rfl:    hydrochlorothiazide (HYDRODIURIL) 50 MG tablet, Take 50 mg by mouth daily., Disp: , Rfl:    ibuprofen  (ADVIL ) 800 MG tablet, Take 1 tablet (800 mg total) by mouth every 8 (eight) hours as needed for moderate pain (pain score 4-6)., Disp: 30 tablet, Rfl: 0   loratadine (CLARITIN) 10 MG tablet, Take 10 mg by mouth daily as needed for allergies., Disp: , Rfl:    methimazole  (TAPAZOLE ) 5 MG tablet, Take 5 mg by mouth daily., Disp: , Rfl:    pantoprazole (PROTONIX) 40 MG tablet, Take 40 mg by mouth daily. (Patient not taking: Reported on 12/04/2023), Disp: , Rfl:    predniSONE  (DELTASONE ) 20 MG tablet, Take 1 tablet (20 mg total) by mouth 2 (two) times daily with a meal for 5 days., Disp: 10 tablet, Rfl: 0   senna-docusate (SENOKOT-S) 8.6-50 MG tablet, Take 2 tablets by mouth at bedtime. For AFTER surgery, do not take if having diarrhea, Disp: 30 tablet, Rfl: 0   Vitamin D, Ergocalciferol, (DRISDOL) 1.25 MG (50000 UNIT) CAPS capsule, Take 50,000 Units by mouth once a week., Disp: , Rfl:   Observations/Objective: Patient is well-developed, well-nourished in no acute distress.  Resting comfortably  at home.  Head is normocephalic, atraumatic.  No labored breathing.  Speech is clear and coherent with logical content.  Patient is alert and oriented at baseline.   Recent Results (from the past 2160 hours)  Chlamydia/Gonococcus/Trichomonas, NAA     Status: None   Collection Time: 12/04/23 12:00 AM   Specimen: Vaginal Swab   Vaginal Swab  Result Value Ref Range   Chlamydia by NAA Negative Negative   Gonococcus  by NAA Negative Negative   Trich vag by NAA Negative Negative    Assessment and Plan:  1. Antibiotic-induced yeast infection (Primary)  Reviewed labs with patient from last week, advised finishing current antibiotic course, check in if URI symptoms persist. If Vaginal itching persists she will use anti-fungal medication Rx as needed     - fluconazole  (DIFLUCAN ) 150 MG tablet; Take 1 tablet (150 mg total) by mouth once for 1 dose.  Dispense: 1 tablet; Refill: 0   F/U in office in 2 weeks   Follow Up Instructions: I discussed the assessment and treatment plan with the patient. The patient was provided an opportunity to ask questions and all were answered. The patient agreed with the plan and demonstrated an understanding of the instructions.  A copy of instructions were sent to the patient via MyChart unless otherwise noted below.    The patient was advised to call back or seek an in-person evaluation if the symptoms worsen or if the condition fails to improve as anticipated.    [name], FNP

## 2023-12-22 ENCOUNTER — Other Ambulatory Visit (HOSPITAL_COMMUNITY): Payer: Self-pay

## 2023-12-22 ENCOUNTER — Telehealth (INDEPENDENT_AMBULATORY_CARE_PROVIDER_SITE_OTHER): Payer: MEDICAID | Admitting: Nurse Practitioner

## 2023-12-22 ENCOUNTER — Other Ambulatory Visit: Payer: Self-pay

## 2023-12-22 ENCOUNTER — Encounter: Payer: Self-pay | Admitting: Nurse Practitioner

## 2023-12-22 ENCOUNTER — Ambulatory Visit: Payer: MEDICAID | Admitting: Nurse Practitioner

## 2023-12-22 VITALS — BP 128/90 | HR 100

## 2023-12-22 DIAGNOSIS — K219 Gastro-esophageal reflux disease without esophagitis: Secondary | ICD-10-CM

## 2023-12-22 DIAGNOSIS — G8929 Other chronic pain: Secondary | ICD-10-CM

## 2023-12-22 DIAGNOSIS — J4540 Moderate persistent asthma, uncomplicated: Secondary | ICD-10-CM | POA: Diagnosis not present

## 2023-12-22 DIAGNOSIS — R52 Pain, unspecified: Secondary | ICD-10-CM | POA: Diagnosis not present

## 2023-12-22 MED ORDER — NAPROXEN 500 MG PO TABS
500.0000 mg | ORAL_TABLET | Freq: Two times a day (BID) | ORAL | 1 refills | Status: DC | PRN
  Filled 2023-12-22 (×2): qty 30, 15d supply, fill #0
  Filled 2024-03-16: qty 30, 15d supply, fill #1

## 2023-12-22 MED ORDER — BUDESONIDE-FORMOTEROL FUMARATE 160-4.5 MCG/ACT IN AERO
2.0000 | INHALATION_SPRAY | Freq: Two times a day (BID) | RESPIRATORY_TRACT | 3 refills | Status: AC
  Filled 2023-12-22: qty 10.2, 30d supply, fill #0
  Filled 2023-12-22: qty 1, fill #0
  Filled 2024-02-20 (×2): qty 10.2, 30d supply, fill #1
  Filled 2024-03-16: qty 10.2, 30d supply, fill #2
  Filled 2024-03-26 (×2): qty 10.2, 30d supply, fill #3

## 2023-12-22 MED ORDER — CYCLOBENZAPRINE HCL 10 MG PO TABS
10.0000 mg | ORAL_TABLET | Freq: Every evening | ORAL | 0 refills | Status: DC | PRN
  Filled 2023-12-22 (×2): qty 30, 30d supply, fill #0

## 2023-12-22 NOTE — Progress Notes (Unsigned)
 Acute Office Visit  Provider on site     Subjective:     Patient ID: Beth Cole, female    DOB: 1961/01/08, 63 y.o.   MRN: [id number]   Location: Patient: [facility] Provider: Virtual Visit Location Provider: Office/Clinic    HPI Breely [facility] Cole is a 63 y.o. who identifies as a female who was assigned female at birth, and is being seen today for complaints of discomfort in her throat.  Her medical history is significant for HTN, hyperthyroidism, GERD, endometrial CA, bipolar disorder, alcohol and opiod abuse, tobacco dependence and hyperlipidemia. She is currently a patient at [facility]. She has presented to [facility] in the past for acute needs, but is not established as a primary care patient at this location.    Today she presents with mainly complaints of throat discomfort and is concerned that her thyroid is swollen she is also complaining of heartburn. When asked if she is taking her pantoprazole patient said no, that she only takes it when needed- but also does not seem to recognize what the medicine is for.   At her previous visit she was seen for a productive cough that has improved - but patient states that she does continue to have an ongoing dry cough at times. When asked about when she uses her Albuterol  inhaler she is unsure. She has a heavy history of smoking/tobacco use and has not been screened for Lung CA recently   She is also complaining of chronic lower back pain with radiation to left hip. She has taken gabapentin without relief- denies numbness or weakness   Review of Systems  Constitutional: Negative.   HENT: Negative.    Eyes: Negative.   Respiratory:  Positive for cough.   Cardiovascular: Negative.   Gastrointestinal:  Positive for heartburn.  Genitourinary: Negative.   Skin: Negative.         Objective:    BP (!) 128/90   Pulse 100   LMP 06/14/2014 (Approximate)   SpO2 96%   Physical  Exam Constitutional:      General: She is not in acute distress.    Appearance: Normal appearance.  HENT:     Head: Normocephalic.     Nose: Nose normal.     Mouth/Throat:     Mouth: Mucous membranes are moist.  Cardiovascular:     Rate and Rhythm: Normal rate.     Heart sounds: Normal heart sounds.  Pulmonary:     Effort: Pulmonary effort is normal.     Breath sounds: Normal breath sounds.  Chest:     Chest wall: No tenderness.  Musculoskeletal:     Cervical back: Neck supple. No tenderness.       Legs:     Comments: Pain distribution sensation intact strength to lower extremities equal bilaterally   Lymphadenopathy:     Cervical: No cervical adenopathy.  Skin:    General: Skin is warm.  Neurological:     Mental Status: She is alert and oriented to person, place, and time.         Assessment & Plan:    Advised patient to return for a physical and to discuss scheduled health maintenance needs. Needs pulmonary workup/ chest CT for screening   Overdue for mammogram/colonoscopy   May require imaging of lower spine if pain persists encouraged to return with any signs of weakness or numbness in lower extremities   Discussed consistently using PPI for manage GERD symptoms.   Encouraged use of  Albuterol  only as needed and started Symbicort  for daily use    1. Moderate persistent asthma, unspecified whether complicated (Primary)  2. Pain  3. Other chronic pain  - cyclobenzaprine  (FLEXERIL ) 10 MG tablet; Take 1 tablet (10 mg total) by mouth at bedtime as needed for muscle spasms.  Dispense: 30 tablet; Refill: 0 - naproxen  (NAPROSYN ) 500 MG tablet; Take 1 tablet (500 mg total) by mouth 2 (two) times daily as needed for moderate pain (pain score 4-6).  Dispense: 30 tablet; Refill: 1  4. Chronic GERD     Meds ordered this encounter  Medications   budesonide -formoterol  (SYMBICORT ) 160-4.5 MCG/ACT inhaler    Sig: Inhale 2 puffs into the lungs 2 (two) times daily.     Dispense:  10.2 g    Refill:  3   cyclobenzaprine  (FLEXERIL ) 10 MG tablet    Sig: Take 1 tablet (10 mg total) by mouth at bedtime as needed for muscle spasms.    Dispense:  30 tablet    Refill:  0   naproxen  (NAPROSYN ) 500 MG tablet    Sig: Take 1 tablet (500 mg total) by mouth 2 (two) times daily as needed for moderate pain (pain score 4-6).    Dispense:  30 tablet    Refill:  1    Return in about 2 months (around 02/21/2024).  Follow Up Instructions: I discussed the assessment and treatment plan with the patient. The patient was provided an opportunity to ask questions and all were answered. The patient agreed with the plan and demonstrated an understanding of the instructions.  A copy of instructions were sent to the patient via MyChart unless otherwise noted below.     The patient was advised to call back or seek an in-person evaluation if the symptoms worsen or if the condition fails to improve as anticipated.    [name], FNP  **Disclaimer: This note may have been dictated with voice recognition software. Similar sounding words can inadvertently be transcribed and this note may contain transcription errors which may not have been corrected upon publication of note.**

## 2023-12-22 NOTE — Progress Notes (Unsigned)
 The patient presented for a primary care visit on (date) to establish care. Blood pressure screening was conducted, and the result was (128/90). During the appointment, the patient did not indicate any (SDOH needs.  A review of the patient's chart revealed that the pt is currently being seen by [facility] and no future appointments were indicated. At this time, no additional support from the Health Equity team is necessary.

## 2024-01-01 ENCOUNTER — Other Ambulatory Visit: Payer: Self-pay

## 2024-01-12 ENCOUNTER — Telehealth: Payer: MEDICAID | Admitting: Nurse Practitioner

## 2024-01-12 VITALS — BP 122/86 | HR 124 | Resp 16 | Wt 160.0 lb

## 2024-01-12 DIAGNOSIS — J4541 Moderate persistent asthma with (acute) exacerbation: Secondary | ICD-10-CM

## 2024-01-12 DIAGNOSIS — R209 Unspecified disturbances of skin sensation: Secondary | ICD-10-CM

## 2024-01-12 DIAGNOSIS — R231 Pallor: Secondary | ICD-10-CM

## 2024-01-12 LAB — POCT INFLUENZA A/B
Influenza A, POC: NEGATIVE
Influenza B, POC: NEGATIVE

## 2024-01-12 LAB — POCT COVID BINAXNOW CARD: SARS Coronavirus 2 Ag: NEGATIVE

## 2024-01-12 NOTE — Progress Notes (Signed)
 Acute Video Visit    Virtual Visit Consent:   [name], you are scheduled for a virtual visit with a [facility] provider today.     Just as with appointments in the office, your consent must be obtained to participate.  Your consent will be active for this visit and any virtual visit you may have with one of our providers in the next 365 days.     If you have a MyChart account, a copy of this consent can be sent to you electronically.  All virtual visits are billed to your insurance company just like a traditional visit in the office.    If the connection with a video visit is poor, the visit may have to be switched to a telephone visit.  With either a video or telephone visit, we are not always able to ensure that we have a secure connection.     I need to obtain your verbal consent now.   Are you willing to proceed with your visit today?    [name] has provided verbal consent on 01/12/2024 for a virtual visit (video or telephone).   [name], FNP  Date: 01/12/2024 2:10 PM  Subjective:     Patient ID: Beth Cole, female    DOB: 21-Apr-1960, 63 y.o.   MRN: [id number]  [name], connected with  Jacquelyn Shadrick  ([id number], 08/16/1960) on 01/12/24 at  1:40 PM EDT by a video-enabled telemedicine application and verified that I am speaking with the correct person using two identifiers.   Location: Patient: [facility] Provider: Virtual Visit Location Provider: Home Office   I discussed the limitations of evaluation and management by telemedicine and the availability of in person appointments. The patient expressed understanding and agreed to proceed.    Chief Complaint  Patient presents with   Follow-up    HPI Beth Cole is a 63 y.o. who identifies as a female who was assigned female at birth, and is being seen today for complaints of feeling clammy  she says this happens often  and feels it is a hot flashes she has experienced since her CA treatment.  She woke up and came straight to the office this morning to see if she was possibly sick.   She was seen in the past month with GERD symptoms, she has since restarted her PPI and says she is taking all of her medicine as directed with improvement in those symptoms   She has also picked up both of her inhalers, she has not used either of them today. States she gets short of breath by walking down the street, continues to be unsure of the directions of inhalers   Patient was unsure if she is sick today and requested respiratory testing, she is concerned about an upcoming oral surgery and does not want to be sick prior to that (01/22/2024 with Dr. Alver)       Objective:    BP 122/86 (BP Location: Left Arm, Patient Position: Sitting, Cuff Size: Normal)   Pulse (!) 124   Resp 16   Wt 160 lb (72.6 kg)   LMP 06/14/2014 (Approximate)   SpO2 93%   BMI 27.46 kg/m    Physical Exam Constitutional:      General: She is not in acute distress.    Appearance: Normal appearance.  HENT:     Mouth/Throat:     Mouth: Mucous membranes are moist.  Pulmonary:     Effort: Pulmonary effort is normal.  Musculoskeletal:     Cervical back: Neck supple.  Neurological:     Mental Status: She is alert and oriented to person, place, and time.  Psychiatric:        Mood and Affect: Mood normal.     Results for orders placed or performed in visit on 01/12/24  POCT COVID BINAX NOW CARD  Result Value Ref Range   SARS Coronavirus 2 Ag Negative Negative  POCT Influenza A/B  Result Value Ref Range   Influenza A, POC Negative Negative   Influenza B, POC Negative Negative        Assessment & Plan:    1. Cold and clammy skin (Primary)  - POCT COVID BINAX NOW CARD - POCT Influenza A/B  2. Moderate persistent asthma with acute exacerbation  Discussed how to use inhalers with patient in detail and encouraged daily use of  Symbicort   Encouraged daily allergy medicine as well (claritin) And as needed used during exacerbation or prior to walking with Albuterol    She will stay in touch if symptoms do not improve with inhaler use   Advised adequate nutrition and hydration.       Follow Up Instructions: I discussed the assessment and treatment plan with the patient. The patient was provided an opportunity to ask questions and all were answered. The patient agreed with the plan and demonstrated an understanding of the instructions.  A copy of instructions were sent to the patient via MyChart unless otherwise noted below.    The patient was advised to call back or seek an in-person evaluation if the symptoms worsen or if the condition fails to improve as anticipated.    [name], FNP  **Disclaimer: This note may have been dictated with voice recognition software. Similar sounding words can inadvertently be transcribed and this note may contain transcription errors which may not have been corrected upon publication of note.**

## 2024-01-22 ENCOUNTER — Other Ambulatory Visit (HOSPITAL_COMMUNITY): Payer: Self-pay

## 2024-01-23 ENCOUNTER — Other Ambulatory Visit (HOSPITAL_COMMUNITY): Payer: Self-pay

## 2024-01-24 ENCOUNTER — Other Ambulatory Visit (HOSPITAL_COMMUNITY): Payer: Self-pay

## 2024-02-09 NOTE — Progress Notes (Signed)
 The patient attended a virtual primary care appt on 12/22/2023 her BP screening results was 128/90. At the [facility] the patient noted she has [employer] insurance and pt noted she does smoke. Patient did not have any SDOH insecurities. Pt pcp listed in [facility] is [facility]. Per chart review pt pcp is Annabella Rigg, NP and the last office visit was 09/24/2023 for numbness of the hand and foot. The pt BP was 141/100 on 09/24/2023. According to chart pt is currently on hydroCHLOROthiazide to manage hypertension. In chart review pt was seen on 01/12/2024 for cold and clammy skin via video visit. There is no future appts indicated in [facility] for pt. No additional Health equity team support indicated at this time.

## 2024-02-16 ENCOUNTER — Ambulatory Visit: Payer: MEDICAID | Admitting: Nurse Practitioner

## 2024-02-19 ENCOUNTER — Other Ambulatory Visit: Payer: Self-pay

## 2024-02-19 ENCOUNTER — Other Ambulatory Visit (HOSPITAL_COMMUNITY): Payer: Self-pay

## 2024-02-19 ENCOUNTER — Telehealth: Payer: MEDICAID | Admitting: Nurse Practitioner

## 2024-02-19 VITALS — BP 127/82 | HR 106 | Resp 16 | Wt 164.0 lb

## 2024-02-19 DIAGNOSIS — M545 Low back pain, unspecified: Secondary | ICD-10-CM | POA: Diagnosis not present

## 2024-02-19 DIAGNOSIS — L308 Other specified dermatitis: Secondary | ICD-10-CM

## 2024-02-19 DIAGNOSIS — R2232 Localized swelling, mass and lump, left upper limb: Secondary | ICD-10-CM | POA: Diagnosis not present

## 2024-02-19 DIAGNOSIS — Z1231 Encounter for screening mammogram for malignant neoplasm of breast: Secondary | ICD-10-CM

## 2024-02-19 DIAGNOSIS — B3731 Acute candidiasis of vulva and vagina: Secondary | ICD-10-CM

## 2024-02-19 DIAGNOSIS — G8929 Other chronic pain: Secondary | ICD-10-CM

## 2024-02-19 MED ORDER — IBUPROFEN 800 MG PO TABS
800.0000 mg | ORAL_TABLET | Freq: Three times a day (TID) | ORAL | 0 refills | Status: AC | PRN
  Filled 2024-02-19 – 2024-02-20 (×2): qty 60, 20d supply, fill #0

## 2024-02-19 MED ORDER — TRIAMCINOLONE ACETONIDE 0.1 % EX CREA
1.0000 | TOPICAL_CREAM | Freq: Two times a day (BID) | CUTANEOUS | 0 refills | Status: DC
  Filled 2024-02-19 – 2024-02-20 (×2): qty 30, 15d supply, fill #0

## 2024-02-19 NOTE — Progress Notes (Unsigned)
 Pt presents for mass on left side above breast for about week

## 2024-02-19 NOTE — Patient Instructions (Addendum)
 Mammogram  [facility] Phone: [phone]

## 2024-02-19 NOTE — Progress Notes (Unsigned)
 Established Patient Office Visit  Virtual Visit Consent:   [name], you are scheduled for a virtual visit with a [facility] provider today.     Just as with appointments in the office, your consent must be obtained to participate.  Your consent will be active for this visit and any virtual visit you may have with one of our providers in the next 365 days.     If you have a MyChart account, a copy of this consent can be sent to you electronically.  All virtual visits are billed to your insurance company just like a traditional visit in the office.    As this is a virtual visit, video technology does not allow for your provider to perform a traditional examination.  This may limit your provider's ability to fully assess your condition.  If your provider identifies any concerns that need to be evaluated in person or the need to arrange testing (such as labs, EKG, etc.), we will make arrangements to do so.     Although advances in technology are sophisticated, we cannot ensure that it will always work on either your end or our end.  If the connection with a video visit is poor, the visit may have to be switched to a telephone visit.  With either a video or telephone visit, we are not always able to ensure that we have a secure connection.     I need to obtain your verbal consent now.   Are you willing to proceed with your visit today?    Beth Cole has provided verbal consent on 02/19/2024 for a virtual visit (video or telephone).   Beth Kitty, FNP  Date: 02/19/2024 11:35 AM  SUBJECTIVE  Patient ID: Beth Cole, female    DOB: Mar 30, 1961  Age: 63 y.o. MRN: [id number]  Beth Cole Beth Cole, connected with  Beth Cole  ([id number], 04-25-1960) on 02/19/24 at 10:00 AM EST by a video-enabled telemedicine application and verified that I am speaking with the correct person using two identifiers.  Telepresenter, Beth Cole,  present for entirety of visit to assist with video functionality and physical examination via TytoCare device.   Location: Patient: Beth Cole Provider: Virtual Visit Location Provider: Home Office   I discussed the limitations of evaluation and management by telemedicine and the availability of in person appointments. The patient expressed understanding and agreed to proceed.     Chief Complaint  Patient presents with   Mass    HPI  Beth Cole is a 63 y.o. who identifies as a female who was assigned female at birth, and is being seen today for dry skin on her face, a lump in her left armpit and chronic recurrent back pain. .   Patient's history is significant for endometrial CA, last mammogram was in 2015.   She has been a patient at [facility] but recently has been visiting the [facility] office at [facility] for acute needs. We have had discussions about transitioning her primary care services in the past with the understanding of the limitation of virtual care.   Today she would like to transition to [facility] for continuity of her primary care needs based on the ease of visiting this office.      Objective:     BP (!) 148/83   LMP 06/14/2014 (Approximate)  BP Readings from Last 3 Encounters:  02/19/24 127/82  01/12/24 122/86  12/22/23 (!) 128/90      Physical Exam Constitutional:      Appearance:  Normal appearance.  Chest:     Chest wall: Mass, swelling and tenderness present.    Neurological:     Mental Status: She is alert.     The 10-year ASCVD risk score (Arnett DK, et al., 2019) is: 21.6%    Assessment & Plan:    Will follow up with imaging results when available    Problem List Items Addressed This Visit   None Visit Diagnoses       Lump of axilla, left    -  Primary   Relevant Orders   US  LIMITED ULTRASOUND INCLUDING AXILLA LEFT BREAST      Encounter for screening mammogram for malignant neoplasm of breast       Relevant Orders    MM Digital Screening         Follow Up Instructions:   1. Lump of axilla, left (Primary)  - US  LIMITED ULTRASOUND INCLUDING AXILLA LEFT BREAST ; Future  2. Encounter for screening mammogram for malignant neoplasm of breast  - MM Digital Screening; Future  3. Other eczema  - triamcinolone cream (KENALOG) 0.1 %; Apply 1 Application topically 2 (two) times daily. 14 days max use  Dispense: 30 g; Refill: 0  4. Chronic bilateral low back pain without sciatica  - ibuprofen  (ADVIL ) 800 MG tablet; Take 1 tablet (800 mg total) by mouth every 8 (eight) hours as needed for moderate pain (pain score 4-6).  Dispense: 60 tablet; Refill: 0  I discussed the assessment and treatment plan with the patient. The patient was provided an opportunity to ask questions and all were answered. The patient agreed with the plan and demonstrated an understanding of the instructions.  A copy of instructions were sent to the patient via MyChart unless otherwise noted below.    The patient was advised to call back or seek an in-person evaluation if the symptoms worsen or if the condition fails to improve as anticipated.   Beth Kitty, FNP  **Disclaimer: This note may have been dictated with voice recognition software. Similar sounding words can inadvertently be transcribed and this note may contain transcription errors which may not have been corrected upon publication of note.**

## 2024-02-20 ENCOUNTER — Other Ambulatory Visit: Payer: Self-pay

## 2024-02-20 ENCOUNTER — Other Ambulatory Visit (HOSPITAL_COMMUNITY): Payer: Self-pay

## 2024-02-20 MED ORDER — FLUCONAZOLE 150 MG PO TABS
150.0000 mg | ORAL_TABLET | Freq: Once | ORAL | 0 refills | Status: AC
  Filled 2024-02-20 (×3): qty 1, 1d supply, fill #0

## 2024-02-23 ENCOUNTER — Other Ambulatory Visit (HOSPITAL_COMMUNITY): Payer: Self-pay

## 2024-02-23 ENCOUNTER — Other Ambulatory Visit: Payer: Self-pay

## 2024-02-23 ENCOUNTER — Telehealth (INDEPENDENT_AMBULATORY_CARE_PROVIDER_SITE_OTHER): Payer: MEDICAID | Admitting: Nurse Practitioner

## 2024-02-23 DIAGNOSIS — F5101 Primary insomnia: Secondary | ICD-10-CM | POA: Diagnosis not present

## 2024-02-23 DIAGNOSIS — Z113 Encounter for screening for infections with a predominantly sexual mode of transmission: Secondary | ICD-10-CM

## 2024-02-23 DIAGNOSIS — S29011A Strain of muscle and tendon of front wall of thorax, initial encounter: Secondary | ICD-10-CM | POA: Diagnosis not present

## 2024-02-23 DIAGNOSIS — Z1159 Encounter for screening for other viral diseases: Secondary | ICD-10-CM

## 2024-02-23 MED ORDER — HYDROXYZINE PAMOATE 25 MG PO CAPS
25.0000 mg | ORAL_CAPSULE | Freq: Every day | ORAL | 0 refills | Status: DC
  Filled 2024-02-23 (×2): qty 30, 30d supply, fill #0

## 2024-02-23 MED ORDER — PREDNISONE 20 MG PO TABS
20.0000 mg | ORAL_TABLET | Freq: Two times a day (BID) | ORAL | 0 refills | Status: AC
  Filled 2024-02-23 (×2): qty 10, 5d supply, fill #0

## 2024-02-23 NOTE — Progress Notes (Unsigned)
 Acute Visit    Location: Patient: in office  Provider: In office     Patient: Beth Cole   DOB: [birth year]   63 y.o. Female  MRN: [id number]  Subjective:      Beth Cole is a 63 y.o. female who presents today for in person assessment of left chest wall swelling and tenderness.   She noted this earlier this week, she has a lump just outside of her armpit that is tender to touch. Denies pain with movement of arm or with lifting objects. Denies pain with deep breaths or activity.   She is overdue for mammogram and has high risk history see PMH. During VV last week mammogram with ultrasound was recommended.   Today she also notes that she is overdue for labs, feels her thyroid medicine is not working. O review of chart she was last prescribed Methimazole  in 2021. Patient states she is still taking daily. Thyroid levels were measures in July of this year and were WNL.   She also complains of insomnia and states that she was using Ambien but has run out. She watched TV in bed and has a difficult time falling asleep.   Continues to use inhalers daily, has not had lung CA screening as advised   Most recent fall risk assessment:    10/25/2019    9:06 AM  Fall Risk   Falls in the past year? 0  Risk for fall due to : Impaired balance/gait  Follow up Falls evaluation completed      Data saved with a previous flowsheet row definition     Most recent depression screenings:    02/19/2024   11:29 AM 12/04/2023   11:09 AM  PHQ 2/9 Scores  PHQ - 2 Score  0  Exception Documentation Patient refusal     Patient Care Team: Kennyth Domino, FNP as PCP - General (Nurse Practitioner)   Outpatient Medications Prior to Visit  Medication Sig   albuterol  (VENTOLIN  HFA) 108 (90 Base) MCG/ACT inhaler Inhale 2 puffs into the lungs every 6 (six) hours as needed for wheezing or shortness of breath.   budesonide -formoterol   (SYMBICORT ) 160-4.5 MCG/ACT inhaler Inhale 2 puffs into the lungs 2 (two) times daily.   cyclobenzaprine  (FLEXERIL ) 10 MG tablet Take 1 tablet (10 mg total) by mouth at bedtime as needed for muscle spasms.   Ergocalciferol (VITAMIN D2 PO) Take 1 capsule by mouth once a week.   hydrochlorothiazide (HYDRODIURIL) 50 MG tablet Take 50 mg by mouth daily.   ibuprofen  (ADVIL ) 800 MG tablet Take 1 tablet (800 mg total) by mouth every 8 (eight) hours as needed for moderate pain (pain score 4-6).   ibuprofen  (ADVIL ) 800 MG tablet Take 1 tablet (800 mg total) by mouth every 8 (eight) hours as needed for moderate pain (pain score 4-6).   loratadine (CLARITIN) 10 MG tablet Take 10 mg by mouth daily as needed for allergies.   methimazole  (TAPAZOLE ) 5 MG tablet Take 5 mg by mouth daily.   naproxen  (NAPROSYN ) 500 MG tablet Take 1 tablet (500 mg total) by mouth 2 (two) times daily as needed for moderate pain (pain score 4-6).   pantoprazole (PROTONIX) 40 MG tablet Take 40 mg by mouth daily.   senna-docusate (SENOKOT-S) 8.6-50 MG tablet  Take 2 tablets by mouth at bedtime. For AFTER surgery, do not take if having diarrhea   triamcinolone cream (KENALOG) 0.1 % Apply 1 Application topically 2 (two) times daily. 14 days max use   Vitamin D, Ergocalciferol, (DRISDOL) 1.25 MG (50000 UNIT) CAPS capsule Take 50,000 Units by mouth once a week.   No facility-administered medications prior to visit.    Review of Systems  Constitutional:  Positive for malaise/fatigue.  HENT: Negative.    Eyes: Negative.   Respiratory:  Positive for cough.   Cardiovascular: Negative.   Musculoskeletal:  Positive for myalgias.  Skin: Negative.   Psychiatric/Behavioral:  The patient has insomnia.     Recent Results (from the past 2160 hours)  Chlamydia/Gonococcus/Trichomonas, NAA     Status: None   Collection Time: 12/04/23 12:00 AM   Specimen: Vaginal Swab   Vaginal Swab  Result Value Ref Range   Chlamydia by NAA Negative Negative    Gonococcus by NAA Negative Negative   Trich vag by NAA Negative Negative  POCT COVID BINAX NOW CARD     Status: None   Collection Time: 01/12/24  1:55 PM  Result Value Ref Range   SARS Coronavirus 2 Ag Negative Negative  POCT Influenza A/B     Status: None   Collection Time: 01/12/24  1:55 PM  Result Value Ref Range   Influenza A, POC Negative Negative   Influenza B, POC Negative Negative  CBC with Differential/Platelet     Status: None   Collection Time: 02/23/24  2:47 PM  Result Value Ref Range   WBC 8.4 3.4 - 10.8 x10E3/uL   RBC 5.04 3.77 - 5.28 x10E6/uL   Hemoglobin 13.6 11.1 - 15.9 g/dL   Hematocrit 58.2 65.9 - 46.6 %   MCV 83 79 - 97 fL   MCH 27.0 26.6 - 33.0 pg   MCHC 32.6 31.5 - 35.7 g/dL   RDW 87.2 88.2 - 84.5 %   Platelets 416 150 - 450 x10E3/uL   Neutrophils 56 Not Estab. %   Lymphs 36 Not Estab. %   Monocytes 6 Not Estab. %   Eos 1 Not Estab. %   Basos 1 Not Estab. %   Neutrophils Absolute 4.7 1.4 - 7.0 x10E3/uL   Lymphocytes Absolute 3.1 0.7 - 3.1 x10E3/uL   Monocytes Absolute 0.5 0.1 - 0.9 x10E3/uL   EOS (ABSOLUTE) 0.1 0.0 - 0.4 x10E3/uL   Basophils Absolute 0.1 0.0 - 0.2 x10E3/uL   Immature Granulocytes 0 Not Estab. %   Immature Grans (Abs) 0.0 0.0 - 0.1 x10E3/uL  Comprehensive metabolic panel with GFR     Status: Abnormal   Collection Time: 02/23/24  2:47 PM  Result Value Ref Range   Glucose 116 (H) 70 - 99 mg/dL   BUN 14 8 - 27 mg/dL   Creatinine, Ser 9.12 0.57 - 1.00 mg/dL   eGFR 76 >40 fO/fpw/8.26   BUN/Creatinine Ratio 16 12 - 28   Sodium 142 134 - 144 mmol/L   Potassium 3.9 3.5 - 5.2 mmol/L   Chloride 106 96 - 106 mmol/L   CO2 21 20 - 29 mmol/L   Calcium 9.9 8.7 - 10.3 mg/dL   Total Protein 7.0 6.0 - 8.5 g/dL   Albumin  4.5 3.9 - 4.9 g/dL   Globulin, Total 2.5 1.5 - 4.5 g/dL   Bilirubin Total 0.2 0.0 - 1.2 mg/dL   Alkaline Phosphatase 83 49 - 135 IU/L   AST 19 0 - 40 IU/L   ALT 15  0 - 32 IU/L  Hemoglobin A1c     Status: Abnormal    Collection Time: 02/23/24  2:47 PM  Result Value Ref Range   Hgb A1c MFr Bld 5.9 (H) 4.8 - 5.6 %    Comment:          Prediabetes: 5.7 - 6.4          Diabetes: >6.4          Glycemic control for adults with diabetes: <7.0    Est. average glucose Bld gHb Est-mCnc 123 mg/dL  Lipid panel     Status: Abnormal   Collection Time: 02/23/24  2:47 PM  Result Value Ref Range   Cholesterol, Total 198 100 - 199 mg/dL   Triglycerides 794 (H) 0 - 149 mg/dL   HDL 50 >60 mg/dL   VLDL Cholesterol Cal 36 5 - 40 mg/dL   LDL Chol Calc (NIH) 887 (H) 0 - 99 mg/dL   Chol/HDL Ratio 4.0 0.0 - 4.4 ratio    Comment:                                   T. Chol/HDL Ratio                                             Men  Women                               1/2 Avg.Risk  3.4    3.3                                   Avg.Risk  5.0    4.4                                2X Avg.Risk  9.6    7.1                                3X Avg.Risk 23.4   11.0   TSH     Status: Abnormal   Collection Time: 02/23/24  2:47 PM  Result Value Ref Range   TSH 0.303 (L) 0.450 - 4.500 uIU/mL  VITAMIN D 25 Hydroxy (Vit-D Deficiency, Fractures)     Status: Abnormal   Collection Time: 02/23/24  2:47 PM  Result Value Ref Range   Vit D, 25-Hydroxy 11.8 (L) 30.0 - 100.0 ng/mL    Comment: Vitamin D deficiency has been defined by the Institute of Medicine and an Endocrine Society practice guideline as a level of serum 25-OH vitamin D less than 20 ng/mL (1,2). The Endocrine Society went on to further define vitamin D insufficiency as a level between 21 and 29 ng/mL (2). 1. IOM (Institute of Medicine). 2010. Dietary reference    intakes for calcium and D. [place]: The    [employer]. 2. Holick MF, Bink[place], Bischoff-Ferrari HA, et al.    Evaluation, treatment, and prevention of vitamin D    deficiency: an Endocrine Society clinical practice    guideline. JCEM. 2011 Jul; 96(7):1911-30.  Hepatitis C antibody     Status:  None   Collection Time: 02/23/24  2:47 PM  Result Value Ref Range   Hep C Virus Ab Non Reactive Non Reactive    Comment: HCV antibody alone does not differentiate between previously resolved infection and active infection. Equivocal and Reactive HCV antibody results should be followed up with an HCV RNA test to support the diagnosis of active HCV infection.   HIV Antibody (routine testing w rflx)     Status: None   Collection Time: 02/23/24  2:47 PM  Result Value Ref Range   HIV Screen 4th Generation wRfx Non Reactive Non Reactive    Comment: HIV-1/HIV-2 antibodies and HIV-1 p24 antigen were NOT detected. There is no laboratory evidence of HIV infection. HIV Negative          Objective:    Physical Exam Constitutional:      Appearance: Normal appearance. She is not ill-appearing.  HENT:     Nose: Nose normal.     Mouth/Throat:     Mouth: Mucous membranes are moist.  Pulmonary:     Effort: Pulmonary effort is normal.     Breath sounds: Normal breath sounds.  Musculoskeletal:     Cervical back: Neck supple.  Skin:        Comments: Soft tissue swelling to left chest wall.   Neurological:     Mental Status: She is alert and oriented to person, place, and time.  Psychiatric:        Mood and Affect: Mood normal.      No results found for any visits on 02/23/24. {Show previous labs (optional):23779}    Assessment & Plan:    Routine Health Maintenance and Physical Exam  Immunization History  Administered Date(s) Administered   PFIZER(Purple Top)SARS-COV-2 Vaccination 06/30/2019, 09/08/2019   Pfizer(Comirnaty)Fall Seasonal Vaccine 12 years and older 01/24/2024    Health Maintenance  Topic Date Due   HIV Screening  Never done   Hepatitis C Screening  Never done   DTaP/Tdap/Td (1 - Tdap) Never done   Pneumococcal Vaccine: 50+ Years (1 of 2 - PCV) Never done   Colonoscopy  Never done   Lung Cancer Screening  Never done   Mammogram  08/27/2015   Influenza  Vaccine  11/14/2023   COVID-19 Vaccine (7 - Pfizer risk 2025-26 season) 07/24/2024   Zoster Vaccines- Shingrix  Completed   Hepatitis B Vaccines 19-59 Average Risk  Aged Out   HPV VACCINES  Aged Out   Meningococcal B Vaccine  Aged Out    Discussed health benefits of physical activity, and encouraged her to engage in regular exercise appropriate for her age and condition.  Problem List Items Addressed This Visit       Other   Insomnia   Relevant Medications   hydrOXYzine (VISTARIL) 25 MG capsule   Other Relevant Orders   CBC with Differential/Platelet   Comprehensive metabolic panel with GFR   Hemoglobin A1c   Lipid panel   TSH   VITAMIN D 25 Hydroxy (Vit-D Deficiency, Fractures)   Hepatitis C antibody   HIV Antibody (routine testing w rflx)   Other Visit Diagnoses       Muscle strain of chest wall, initial encounter    -  Primary   Relevant Medications   predniSONE  (DELTASONE ) 20 MG tablet     Need for hepatitis C screening test       Relevant Orders   Hepatitis C antibody     Encounter  for screening examination for sexually transmitted disease       Relevant Orders   HIV Antibody (routine testing w rflx)      No follow-ups on file.     [name], FNP  **Disclaimer: This note may have been dictated with voice recognition software. Similar sounding words can inadvertently be transcribed and this note may contain transcription errors which may not have been corrected upon publication of note.**

## 2024-02-24 LAB — CBC WITH DIFFERENTIAL/PLATELET
Basophils Absolute: 0.1 x10E3/uL (ref 0.0–0.2)
Basos: 1 %
EOS (ABSOLUTE): 0.1 x10E3/uL (ref 0.0–0.4)
Eos: 1 %
Hematocrit: 41.7 % (ref 34.0–46.6)
Hemoglobin: 13.6 g/dL (ref 11.1–15.9)
Immature Grans (Abs): 0 x10E3/uL (ref 0.0–0.1)
Immature Granulocytes: 0 %
Lymphocytes Absolute: 3.1 x10E3/uL (ref 0.7–3.1)
Lymphs: 36 %
MCH: 27 pg (ref 26.6–33.0)
MCHC: 32.6 g/dL (ref 31.5–35.7)
MCV: 83 fL (ref 79–97)
Monocytes Absolute: 0.5 x10E3/uL (ref 0.1–0.9)
Monocytes: 6 %
Neutrophils Absolute: 4.7 x10E3/uL (ref 1.4–7.0)
Neutrophils: 56 %
Platelets: 416 x10E3/uL (ref 150–450)
RBC: 5.04 x10E6/uL (ref 3.77–5.28)
RDW: 12.7 % (ref 11.7–15.4)
WBC: 8.4 x10E3/uL (ref 3.4–10.8)

## 2024-02-24 LAB — COMPREHENSIVE METABOLIC PANEL WITH GFR
ALT: 15 IU/L (ref 0–32)
AST: 19 IU/L (ref 0–40)
Albumin: 4.5 g/dL (ref 3.9–4.9)
Alkaline Phosphatase: 83 IU/L (ref 49–135)
BUN/Creatinine Ratio: 16 (ref 12–28)
BUN: 14 mg/dL (ref 8–27)
Bilirubin Total: 0.2 mg/dL (ref 0.0–1.2)
CO2: 21 mmol/L (ref 20–29)
Calcium: 9.9 mg/dL (ref 8.7–10.3)
Chloride: 106 mmol/L (ref 96–106)
Creatinine, Ser: 0.87 mg/dL (ref 0.57–1.00)
Globulin, Total: 2.5 g/dL (ref 1.5–4.5)
Glucose: 116 mg/dL — ABNORMAL HIGH (ref 70–99)
Potassium: 3.9 mmol/L (ref 3.5–5.2)
Sodium: 142 mmol/L (ref 134–144)
Total Protein: 7 g/dL (ref 6.0–8.5)
eGFR: 76 mL/min/1.73 (ref >59)

## 2024-02-24 LAB — LIPID PANEL
Chol/HDL Ratio: 4 ratio (ref 0.0–4.4)
Cholesterol, Total: 198 mg/dL (ref 100–199)
HDL: 50 mg/dL (ref >39)
LDL Chol Calc (NIH): 112 mg/dL — ABNORMAL HIGH (ref 0–99)
Triglycerides: 205 mg/dL — ABNORMAL HIGH (ref 0–149)
VLDL Cholesterol Cal: 36 mg/dL (ref 5–40)

## 2024-02-24 LAB — TSH: TSH: 0.303 u[IU]/mL — ABNORMAL LOW (ref 0.450–4.500)

## 2024-02-24 LAB — HEPATITIS C ANTIBODY: Hep C Virus Ab: NONREACTIVE

## 2024-02-24 LAB — HIV ANTIBODY (ROUTINE TESTING W REFLEX): HIV Screen 4th Generation wRfx: NONREACTIVE

## 2024-02-24 LAB — HEMOGLOBIN A1C
Est. average glucose Bld gHb Est-mCnc: 123 mg/dL
Hgb A1c MFr Bld: 5.9 % — ABNORMAL HIGH (ref 4.8–5.6)

## 2024-02-24 LAB — VITAMIN D 25 HYDROXY (VIT D DEFICIENCY, FRACTURES): Vit D, 25-Hydroxy: 11.8 ng/mL — ABNORMAL LOW (ref 30.0–100.0)

## 2024-02-25 LAB — TSH+T4F+T3FREE
Free T4: 1.02 ng/dL (ref 0.82–1.77)
T3, Free: 3.7 pg/mL (ref 2.0–4.4)
TSH: 0.325 u[IU]/mL — ABNORMAL LOW (ref 0.450–4.500)

## 2024-02-25 LAB — SPECIMEN STATUS REPORT

## 2024-03-01 ENCOUNTER — Encounter: Payer: Self-pay | Admitting: Nurse Practitioner

## 2024-03-02 ENCOUNTER — Telehealth: Payer: Self-pay | Admitting: Nurse Practitioner

## 2024-03-02 ENCOUNTER — Other Ambulatory Visit: Payer: Self-pay

## 2024-03-02 DIAGNOSIS — J0140 Acute pansinusitis, unspecified: Secondary | ICD-10-CM

## 2024-03-02 MED ORDER — AMOXICILLIN-POT CLAVULANATE 875-125 MG PO TABS
1.0000 | ORAL_TABLET | Freq: Two times a day (BID) | ORAL | 0 refills | Status: AC
  Filled 2024-03-02: qty 14, 7d supply, fill #0

## 2024-03-02 MED ORDER — FLUTICASONE PROPIONATE 50 MCG/ACT NA SUSP
2.0000 | Freq: Every day | NASAL | 6 refills | Status: AC
  Filled 2024-03-02: qty 16, 30d supply, fill #0

## 2024-03-02 NOTE — Telephone Encounter (Signed)
 Patient was seen last week, has ongoing sinus congestion with green drainage. Requesting antibiotic for sinus infection at this time  1. Acute non-recurrent pansinusitis (Primary)  - amoxicillin -clavulanate (AUGMENTIN ) 875-125 MG tablet; Take 1 tablet by mouth 2 (two) times daily for 7 days.  Dispense: 14 tablet; Refill: 0 - fluticasone  (FLONASE ) 50 MCG/ACT nasal spray; Place 2 sprays into both nostrils daily.  Dispense: 16 g; Refill: 6

## 2024-03-03 ENCOUNTER — Other Ambulatory Visit: Payer: Self-pay

## 2024-03-03 ENCOUNTER — Other Ambulatory Visit (HOSPITAL_COMMUNITY): Payer: Self-pay

## 2024-03-15 ENCOUNTER — Other Ambulatory Visit: Payer: MEDICAID

## 2024-03-16 ENCOUNTER — Other Ambulatory Visit: Payer: Self-pay

## 2024-03-16 ENCOUNTER — Ambulatory Visit
Admission: RE | Admit: 2024-03-16 | Discharge: 2024-03-16 | Disposition: A | Payer: MEDICAID | Source: Ambulatory Visit | Attending: Nurse Practitioner

## 2024-03-16 ENCOUNTER — Other Ambulatory Visit: Payer: Self-pay | Admitting: Nurse Practitioner

## 2024-03-16 DIAGNOSIS — G8929 Other chronic pain: Secondary | ICD-10-CM

## 2024-03-16 DIAGNOSIS — F5101 Primary insomnia: Secondary | ICD-10-CM

## 2024-03-16 DIAGNOSIS — R2232 Localized swelling, mass and lump, left upper limb: Secondary | ICD-10-CM

## 2024-03-16 DIAGNOSIS — L308 Other specified dermatitis: Secondary | ICD-10-CM

## 2024-03-16 MED ORDER — CYCLOBENZAPRINE HCL 10 MG PO TABS
10.0000 mg | ORAL_TABLET | Freq: Every evening | ORAL | 0 refills | Status: DC | PRN
  Filled 2024-03-16: qty 30, 30d supply, fill #0

## 2024-03-16 MED ORDER — HYDROXYZINE PAMOATE 25 MG PO CAPS
25.0000 mg | ORAL_CAPSULE | Freq: Every day | ORAL | 0 refills | Status: DC
  Filled 2024-03-16: qty 30, 30d supply, fill #0

## 2024-03-16 MED ORDER — TRIAMCINOLONE ACETONIDE 0.1 % EX CREA
1.0000 | TOPICAL_CREAM | Freq: Two times a day (BID) | CUTANEOUS | 0 refills | Status: DC
  Filled 2024-03-16: qty 30, 15d supply, fill #0

## 2024-03-18 ENCOUNTER — Other Ambulatory Visit (HOSPITAL_COMMUNITY): Payer: Self-pay

## 2024-03-19 ENCOUNTER — Other Ambulatory Visit: Payer: Self-pay

## 2024-03-19 ENCOUNTER — Other Ambulatory Visit (HOSPITAL_BASED_OUTPATIENT_CLINIC_OR_DEPARTMENT_OTHER): Payer: Self-pay

## 2024-03-19 ENCOUNTER — Other Ambulatory Visit (HOSPITAL_COMMUNITY): Payer: Self-pay

## 2024-03-24 ENCOUNTER — Other Ambulatory Visit (HOSPITAL_COMMUNITY): Payer: Self-pay

## 2024-03-25 ENCOUNTER — Other Ambulatory Visit (HOSPITAL_COMMUNITY): Payer: Self-pay

## 2024-03-26 ENCOUNTER — Other Ambulatory Visit (HOSPITAL_COMMUNITY): Payer: Self-pay

## 2024-03-26 ENCOUNTER — Other Ambulatory Visit: Payer: Self-pay

## 2024-03-26 ENCOUNTER — Other Ambulatory Visit: Payer: Self-pay | Admitting: Nurse Practitioner

## 2024-03-26 ENCOUNTER — Other Ambulatory Visit (HOSPITAL_BASED_OUTPATIENT_CLINIC_OR_DEPARTMENT_OTHER): Payer: Self-pay

## 2024-03-26 DIAGNOSIS — F5101 Primary insomnia: Secondary | ICD-10-CM

## 2024-03-26 DIAGNOSIS — G8929 Other chronic pain: Secondary | ICD-10-CM

## 2024-03-26 DIAGNOSIS — L308 Other specified dermatitis: Secondary | ICD-10-CM

## 2024-03-29 ENCOUNTER — Other Ambulatory Visit (HOSPITAL_COMMUNITY): Payer: Self-pay

## 2024-03-29 MED ORDER — HYDROXYZINE PAMOATE 25 MG PO CAPS
25.0000 mg | ORAL_CAPSULE | Freq: Every day | ORAL | 0 refills | Status: AC
  Filled 2024-03-29: qty 30, 30d supply, fill #0

## 2024-03-29 MED ORDER — NAPROXEN 500 MG PO TABS
500.0000 mg | ORAL_TABLET | Freq: Two times a day (BID) | ORAL | 1 refills | Status: AC | PRN
  Filled 2024-03-29: qty 30, 15d supply, fill #0

## 2024-03-29 MED ORDER — CYCLOBENZAPRINE HCL 10 MG PO TABS
10.0000 mg | ORAL_TABLET | Freq: Every evening | ORAL | 0 refills | Status: DC | PRN
  Filled 2024-03-29: qty 30, 30d supply, fill #0

## 2024-03-29 MED ORDER — TRIAMCINOLONE ACETONIDE 0.1 % EX CREA
1.0000 | TOPICAL_CREAM | Freq: Two times a day (BID) | CUTANEOUS | 0 refills | Status: DC
  Filled 2024-03-29: qty 30, 15d supply, fill #0

## 2024-03-30 ENCOUNTER — Other Ambulatory Visit: Payer: Self-pay

## 2024-03-30 ENCOUNTER — Other Ambulatory Visit (HOSPITAL_COMMUNITY): Payer: Self-pay

## 2024-03-31 ENCOUNTER — Telehealth: Payer: Self-pay | Admitting: Nurse Practitioner

## 2024-03-31 ENCOUNTER — Other Ambulatory Visit (HOSPITAL_COMMUNITY): Payer: Self-pay

## 2024-03-31 DIAGNOSIS — K219 Gastro-esophageal reflux disease without esophagitis: Secondary | ICD-10-CM

## 2024-03-31 DIAGNOSIS — R052 Subacute cough: Secondary | ICD-10-CM

## 2024-03-31 DIAGNOSIS — L308 Other specified dermatitis: Secondary | ICD-10-CM

## 2024-03-31 MED ORDER — BENZONATATE 100 MG PO CAPS
100.0000 mg | ORAL_CAPSULE | Freq: Three times a day (TID) | ORAL | 0 refills | Status: AC | PRN
  Filled 2024-03-31: qty 90, 30d supply, fill #0

## 2024-03-31 MED ORDER — TRIAMCINOLONE ACETONIDE 0.1 % EX CREA
1.0000 | TOPICAL_CREAM | Freq: Two times a day (BID) | CUTANEOUS | 0 refills | Status: AC
  Filled 2024-03-31: qty 30, 15d supply, fill #0

## 2024-03-31 MED ORDER — PANTOPRAZOLE SODIUM 40 MG PO TBEC
40.0000 mg | DELAYED_RELEASE_TABLET | Freq: Every day | ORAL | 3 refills | Status: AC
  Filled 2024-03-31: qty 90, 90d supply, fill #0

## 2024-03-31 NOTE — Telephone Encounter (Addendum)
 Requesting resend on medication to [facility] did not receive delivery   1. Other eczema  - triamcinolone  cream (KENALOG ) 0.1 %; Apply 1 Application topically 2 (two) times daily. 14 days max use  Dispense: 30 g; Refill: 0  2. Gastroesophageal reflux disease without esophagitis (Primary)  - pantoprazole  (PROTONIX ) 40 MG tablet; Take 1 tablet (40 mg total) by mouth daily.  Dispense: 90 tablet; Refill: 3  3. Subacute cough  - benzonatate  (TESSALON ) 100 MG capsule; Take 1 capsule (100 mg total) by mouth 3 (three) times daily as needed.  Dispense: 90 capsule; Refill: 0

## 2024-04-01 ENCOUNTER — Other Ambulatory Visit: Payer: Self-pay

## 2024-04-01 ENCOUNTER — Telehealth: Payer: MEDICAID | Admitting: Nurse Practitioner

## 2024-04-01 ENCOUNTER — Encounter: Payer: Self-pay | Admitting: Nurse Practitioner

## 2024-04-01 VITALS — BP 138/89 | HR 116 | Resp 14 | Wt 174.0 lb

## 2024-04-01 DIAGNOSIS — I10 Essential (primary) hypertension: Secondary | ICD-10-CM

## 2024-04-01 DIAGNOSIS — G8929 Other chronic pain: Secondary | ICD-10-CM | POA: Diagnosis not present

## 2024-04-01 DIAGNOSIS — K219 Gastro-esophageal reflux disease without esophagitis: Secondary | ICD-10-CM

## 2024-04-01 DIAGNOSIS — F17200 Nicotine dependence, unspecified, uncomplicated: Secondary | ICD-10-CM | POA: Diagnosis not present

## 2024-04-01 MED ORDER — NICOTINE POLACRILEX 4 MG MT LOZG
4.0000 mg | LOZENGE | OROMUCOSAL | 0 refills | Status: AC | PRN
  Filled 2024-04-01: qty 72, 24d supply, fill #0

## 2024-04-01 MED ORDER — CYCLOBENZAPRINE HCL 10 MG PO TABS
10.0000 mg | ORAL_TABLET | Freq: Every evening | ORAL | 0 refills | Status: AC | PRN
  Filled 2024-04-01: qty 30, 30d supply, fill #0

## 2024-04-01 MED ORDER — FAMOTIDINE 20 MG PO TABS
20.0000 mg | ORAL_TABLET | Freq: Two times a day (BID) | ORAL | 0 refills | Status: AC
  Filled 2024-04-01: qty 90, 45d supply, fill #0

## 2024-04-01 MED ORDER — HYDROCHLOROTHIAZIDE 50 MG PO TABS
50.0000 mg | ORAL_TABLET | Freq: Every day | ORAL | 0 refills | Status: AC
  Filled 2024-04-01: qty 90, 90d supply, fill #0

## 2024-04-01 NOTE — Progress Notes (Signed)
 Pt presents for med refills -HCTZ 50 mg -cyclobenzaprine  10 mg -experiencing SOB

## 2024-04-01 NOTE — Progress Notes (Signed)
 Acute Office Visit  Virtual Visit Consent:   [name], you are scheduled for a virtual visit with a [facility] provider today.     Just as with appointments in the office, your consent must be obtained to participate.  Your consent will be active for this visit and any virtual visit you may have with one of our providers in the next 365 days.     If you have a MyChart account, a copy of this consent can be sent to you electronically.  All virtual visits are billed to your insurance company just like a traditional visit in the office.    If the connection with a video visit is poor, the visit may have to be switched to a telephone visit.  With either a video or telephone visit, we are not always able to ensure that we have a secure connection.     I need to obtain your verbal consent now.   Are you willing to proceed with your visit today?    [name] has provided verbal consent on 04/01/2024 for a virtual visit (video or telephone).   [name], FNP  Date: 04/01/2024 12:06 PM  Subjective:     Patient ID: Beth Cole, female    DOB: 10/06/1960, 63 y.o.   MRN: [id number]  [name], connected with  Aviella Disbrow  ([id number], [birth year]) on 04/01/2024 at 11:40 AM EST by a video-enabled telemedicine application and verified that I am speaking with the correct person using two identifiers.  Zackary Berlinda Pouch , present for entirety of visit to assist with video functionality and physical examination via TytoCare device.  Location: Patient: [name] Provider: Virtual Visit Location Provider: Home Office   I discussed the limitations of evaluation and management by telemedicine and the availability of in person appointments. The patient expressed understanding and agreed to proceed.    Chief Complaint  Patient presents with   Shortness of Breath    HPI  Beth Cole is a  63 y.o. who identifies as a female who was assigned female at birth, and is being seen today for neck pain that radiates into both arms, request for medication refill and as needed medication for reflux symptoms  She successfully quit smoking 2 weeks ago. She did this without the assistance of medications or supplements. She is interested in nicorette  lozenges     Review of Systems  Constitutional: Negative.   HENT: Negative.    Respiratory: Negative.    Cardiovascular: Negative.   Gastrointestinal:  Positive for heartburn.  Musculoskeletal:  Positive for neck pain.  Skin: Negative.   Neurological: Negative.         Objective:    BP 138/89 (BP Location: Left Arm, Patient Position: Sitting, Cuff Size: Normal)   Pulse (!) 116   Resp 14   Wt 174 lb (78.9 kg)   LMP 06/14/2014   SpO2 98%   BMI 29.87 kg/m  BP Readings from Last 3 Encounters:  04/01/24 138/89  02/19/24 127/82  01/12/24 122/86      Physical Exam Constitutional:      General: She is not in acute distress.    Appearance: She is well-developed. She is not ill-appearing.  Cardiovascular:     Rate and Rhythm: Normal rate.  Pulmonary:     Effort: Pulmonary effort is normal.     Breath sounds: Normal breath sounds.  Musculoskeletal:        General: Normal range of motion.     Cervical  back: Normal range of motion.  Neurological:     Mental Status: She is alert.          Assessment & Plan:   1. Primary hypertension (Primary)  - hydrochlorothiazide  (HYDRODIURIL ) 50 MG tablet; Take 1 tablet (50 mg total) by mouth daily.  Dispense: 90 tablet; Refill: 0  2. Other chronic pain  - cyclobenzaprine  (FLEXERIL ) 10 MG tablet; Take 1 tablet (10 mg total) by mouth at bedtime as needed for muscle spasms.  Dispense: 30 tablet; Refill: 0  3. Gastroesophageal reflux disease without esophagitis  - famotidine  (PEPCID ) 20 MG tablet; Take 1 tablet (20 mg total) by mouth 2 (two) times daily.  Dispense: 90 tablet;  Refill: 0  4. Tobacco dependence  - nicotine  polacrilex (NICORETTE ) 4 MG lozenge; Take 1 lozenge (4 mg total) by mouth as needed for smoking cessation.  Dispense: 100 tablet; Refill: 0      Follow Up Instructions: I discussed the assessment and treatment plan with the patient. The patient was provided an opportunity to ask questions and all were answered. The patient agreed with the plan and demonstrated an understanding of the instructions.  A copy of instructions were sent to the patient via MyChart unless otherwise noted below.    The patient was advised to call back or seek an in-person evaluation if the symptoms worsen or if the condition fails to improve as anticipated.    [name], FNP  **Disclaimer: This note may have been dictated with voice recognition software. Similar sounding words can inadvertently be transcribed and this note may contain transcription errors which may not have been corrected upon publication of note.**

## 2024-04-27 ENCOUNTER — Ambulatory Visit: Payer: Self-pay

## 2024-04-27 NOTE — Telephone Encounter (Addendum)
 FYI Only or Action Required?: Action required by provider: declines ED.  Patient was last seen in primary care on 04/01/2024 by Cole Domino, FNP.  Called Nurse Triage reporting No chief complaint on file..  Symptoms began a week ago.  Interventions attempted: Nothing.  Symptoms are: gradually worsening.  Triage Disposition: No disposition on file.  Patient/caregiver understands and will follow disposition?:  Caller declines ED.   Copied from CRM [id number]. Topic: Clinical - Red Word Triage >> Apr 27, 2024  3:00 PM Beth Cole wrote: Red Word that prompted transfer to Nurse Triage: Having heart problems. States that she's having pains on both sides all the way through and by her arms and in her chest. States it's been happening for over a week. Wants to see Beth Cole at the virtual clinic. Reason for Disposition  [1] Chest pain (or angina) comes and goes AND [2] is happening more often (increasing in frequency) or getting worse (increasing in severity)  (Exception: Chest pains that last only a few seconds.)  Protocols used: Chest Pain-A-AH

## 2024-04-27 NOTE — Telephone Encounter (Signed)
 Answer Assessment - Initial Assessment Questions 1. LOCATION: Where does it hurt?       chest 2. RADIATION: Does the pain go anywhere else? (e.g., into neck, jaw, arms, back)     To the back 3. ONSET: When did the chest pain begin? (Minutes, hours or days)      A week ago 4. PATTERN: Does the pain come and go, or has it been constant since it started?  Does it get worse with exertion?      intermittent 5. DURATION: How long does it last (e.g., seconds, minutes, hours)      6. SEVERITY: How bad is the pain?  (e.g., Scale 1-10; mild, moderate, or severe)     moderate 7. CARDIAC RISK FACTORS: Do you have any history of heart problems or risk factors for heart disease? (e.g., angina, prior heart attack; diabetes, high blood pressure, high cholesterol, smoker, or strong family history of heart disease)      8. PULMONARY RISK FACTORS: Do you have any history of lung disease?  (e.g., blood clots in lung, asthma, emphysema, birth control pills)      9. CAUSE: What do you think is causing the chest pain?      10. OTHER SYMPTOMS: Do you have any other symptoms? (e.g., dizziness, nausea, vomiting, sweating, fever, difficulty breathing, cough)       denies 11. PREGNANCY: Is there any chance you are pregnant? When was your last menstrual period?  Protocols used: Chest Pain-A-AH

## 2024-04-29 ENCOUNTER — Ambulatory Visit: Payer: MEDICAID | Admitting: Nurse Practitioner

## 2024-05-06 ENCOUNTER — Telehealth: Payer: MEDICAID | Admitting: Nurse Practitioner

## 2024-05-06 VITALS — BP 128/83 | HR 117 | Resp 16 | Wt 170.0 lb

## 2024-05-06 DIAGNOSIS — E039 Hypothyroidism, unspecified: Secondary | ICD-10-CM

## 2024-05-06 MED ORDER — METHIMAZOLE 5 MG PO TABS
5.0000 mg | ORAL_TABLET | Freq: Every day | ORAL | 0 refills | Status: DC
  Filled 2024-05-06: qty 90, 90d supply, fill #0

## 2024-05-06 MED ORDER — METHIMAZOLE 5 MG PO TABS
5.0000 mg | ORAL_TABLET | Freq: Every day | ORAL | 0 refills | Status: AC
  Filled 2024-05-06: qty 90, 90d supply, fill #0

## 2024-05-06 NOTE — Patient Instructions (Addendum)
 Pharmacy phone number [phone]    Pantry Distribution by: [facility] Next Steps: Go to the nearest location to get services. Call [phone] to get more info.  About: [employer] pantry provides quality food items to households experiencing foodinsecurity. This program supplies critical nutrition to hungry individuals and families.   Eligibility: Anyone can access this program.   Nearest location: 5.31 miles away. [facility] [address] [place], [place] [address]  [phone]  Note: The food pantry is open every second Saturday ofthe month.? ?   Hours:Saturday:10:00 AM - 12:00 PM ----------------------  Food Pantry by: [facility] Next Steps: Go to the nearest location to get services. Call [phone] to get services.  About: The [facility] Food Pantry provides emergency groceries to community members struggling to access nutritious andadequate amounts of food necessary for a healthy diet.   Eligibility: This program is available to anyone in need.   Nearest location: 5.34 miles away. [facility] [address] [place], [place] [address]  [phone]?   Hours: Monday:09:00 AM - 03:30 PM Tuesday:09:00 AM - 03:30 PM Wednesday:09:00 AM - 03:30 PM Thursday:09:00 AM - 03:30 PM Friday:09:00 AM - 03:30 PM ----------------------  Food Pantry by: [facility] [employer] Next Steps: Go to the nearest location to get more info.  About: The food pantry provides quality food items to households experiencing food insecurity. This program supplies criticalnutrition to hungry individuals and families.   Eligibility: Anyone can access this program.   Nearest location: 10.59 miles away. [address] [facility] [address] [place], [place] [address]  [phone]  Note: Food pantry is open every other Saturday. Contactthe office extension [phone] for more  information.? ?   Hours: Saturday:10:00 AM - 12:00 PM ----------------------  Food and Clothing Pantry by: [employer] Next Steps: Email [email or web address] [email or web address] to apply. Call [phone] to get more info.  About: This food and clothing closet provides free basic clothing and food to meet basic nutritional needs to families andindividuals in need. The pantry supplies donations to residents who lack the means or resources to purchase them on theirown.   Eligibility: Anyone can access this program.   Nearest location: 5.93 miles away. The [employer] [address] [place], [place] [address]  [phone]?   Hours: Sunday:12:00 AM - 02:00 PM Monday:12:00 AM - 02:00 PM Tuesday:12:00 AM - 02:00 PM Wednesday:12:00 AM - 02:00 PM Thursday:12:00 AM - 02:00 PM Friday:12:00 AM - 02:00 PM ----------------------  Missionary Ministry by: [facility] Next Steps: Go to the nearest location to get more info.  About: [employer] has the purpose of carrying for the commandment given by God in [place] 25:36. To visit the sickand shut-ins and provide for needs in the surrounding communities.   Eligibility: Anyone can access this program.   Nearest location: 4.53 miles away. [facility] [address] [place], [place] [address]  [phone]?   Hours: Tuesday:10:00 AM - 02:00 PM Thursday:10:00 AM - 02:00 PM ----------------------  Food Pantry by: [facility] Next Steps: Call [phone] ext.[phone] to schedule an appointment.  About: [employer] offers a Veterinary Surgeon to serve hungry families in the community.This program provides:   Eligibility: Anyone can access this program.   Nearest location: 3.71 miles away. The [facility] [address] [place], [place] [address]  [phone] ext.[phone]?   Hours: Saturday:10:00 AM - 11:30  AM ----------------------  [employer] Food Pantry by: [employer] Next Steps: Call [phone] to get more info. Go to the nearest location to get services.  About: The [employer] Food Pantry provides quality food items to households experiencing food insecurity. This programsupplies critical nutrition to hungry individuals and families.   Nearest location: 5.86 miles away. [facility] [employer] [address] [place], [place] [address]  [phone] ext.?   Hours: Saturday:10:00 AM - 12:00 PM  Note: The food pantry is open every 3rd Saturday of themonth at the parking lot behind the [facility] [employer]. ----------------------   Food Pantry by: [facility] Next Steps: Call [phone] to get services.  About: The food pantry provides quality food items to households experiencing food insecurity. This program supplies criticalnutrition to hungry individuals and families.   Eligibility: Anyone can access this program.   Nearest location: 5.22 miles away. [facility] [address] [place], [place] [address]  [phone]   Note: Food Pantry is available every two weeks onTuesdays. It is advised to call before visiting to confirm daysof operation.  Hours: Tuesday:12:00 PM - 02:00 PM

## 2024-05-06 NOTE — Progress Notes (Signed)
 Refill provided patient scheduled for visit tomorrow due to connectivity issues during visit

## 2024-05-06 NOTE — Progress Notes (Signed)
 Pt present for issues w/mass on left side above breast -refill on methimazole  5mg 

## 2024-05-07 ENCOUNTER — Other Ambulatory Visit (HOSPITAL_COMMUNITY): Payer: Self-pay

## 2024-05-07 ENCOUNTER — Other Ambulatory Visit: Payer: Self-pay

## 2024-05-07 ENCOUNTER — Telehealth: Payer: MEDICAID | Admitting: Nurse Practitioner

## 2024-05-07 DIAGNOSIS — R5382 Chronic fatigue, unspecified: Secondary | ICD-10-CM | POA: Diagnosis not present

## 2024-05-07 DIAGNOSIS — E0590 Thyrotoxicosis, unspecified without thyrotoxic crisis or storm: Secondary | ICD-10-CM | POA: Diagnosis not present

## 2024-05-07 DIAGNOSIS — D171 Benign lipomatous neoplasm of skin and subcutaneous tissue of trunk: Secondary | ICD-10-CM

## 2024-05-07 NOTE — Progress Notes (Signed)
 "  Acute Video Visit    Virtual Visit Consent:   Beth Cole, you are scheduled for a virtual visit with a [facility] provider today.     Just as with appointments in the office, your consent must be obtained to participate.  Your consent will be active for this visit and any virtual visit you may have with one of our providers in the next 365 days.     If you have a MyChart account, a copy of this consent can be sent to you electronically.  All virtual visits are billed to your insurance company just like a traditional visit in the office.    If the connection with a video visit is poor, the visit may have to be switched to a telephone visit.  With either a video or telephone visit, we are not always able to ensure that we have a secure connection.     I need to obtain your verbal consent now.   Are you willing to proceed with your visit today?    Beth Cole has provided verbal consent on 05/07/2024 for a virtual visit (video or telephone).   Beth Kitty, FNP  Date: 05/07/2024 8:18 AM  Subjective:     Patient ID: Beth Cole, female    DOB: 1960-11-26, 64 y.o.   MRN: [id number]  Beth Cole, connected with  Beth Cole  ([id number], 1960-09-25) on 05/07/24 at  8:40 AM EST by a video-enabled telemedicine application and verified that I am speaking with the correct person using two identifiers.   Location: Patient: Home  Provider: Virtual Visit Location Provider: Home Office   I discussed the limitations of evaluation and management by telemedicine and the availability of in person appointments. The patient expressed understanding and agreed to proceed.     HPI  Beth Cole is a 64 y.o. who identifies as a female who was assigned female at birth, and is being seen today for follow up regarding request for surgical referral to discuss removal of her lipoma on her left chest. Imaging with  ultrasound was performed on 03/16/24.   Soft tissue mass is just over her left breast, mammogram was performed to assure this was not breast tissue.   The area continues to grow in size and bother her.   Today she is also complaining of inability to walk to the grocery store. She does not have a care so typically she will walk to get her food. She was wondering if she could qualify for a medical scooter to help with this.  Medical history is significant for HTN, hyperthyroid, bipolar, history of substance abuse- no current and chronic fatigue          Objective:    LMP 06/14/2014  BP Readings from Last 3 Encounters:  05/06/24 128/83  04/01/24 138/89  02/19/24 127/82      Physical Exam Constitutional:      General: She is not in acute distress.    Appearance: Normal appearance. She is not ill-appearing.  HENT:     Nose: Nose normal.     Mouth/Throat:     Mouth: Mucous membranes are moist.  Pulmonary:     Effort: Pulmonary effort is normal.  Neurological:     Mental Status: She is alert and oriented to person, place, and time.  Psychiatric:        Mood and Affect: Mood normal.          Assessment & Plan:    1. Lipoma of  torso (Primary)  - Ambulatory referral to General Surgery  2. Chronic fatigue  - DME Wheelchair electric  3. Hyperthyroidism  - DME Wheelchair electric   Follow Up Instructions: I discussed the assessment and treatment plan with the patient. The patient was provided an opportunity to ask questions and all were answered. The patient agreed with the plan and demonstrated an understanding of the instructions.  A copy of instructions were sent to the patient via MyChart unless otherwise noted below.     The patient was advised to call back or seek an in-person evaluation if the symptoms worsen or if the condition fails to improve as anticipated.    Beth Kitty, FNP  **Disclaimer: This note may have been dictated with voice recognition  software. Similar sounding words can inadvertently be transcribed and this note may contain transcription errors which may not have been corrected upon publication of note.** "

## 2024-05-10 ENCOUNTER — Other Ambulatory Visit: Payer: Self-pay

## 2024-05-10 ENCOUNTER — Encounter: Payer: Self-pay | Admitting: Pharmacist

## 2024-05-12 ENCOUNTER — Other Ambulatory Visit: Payer: Self-pay
